# Patient Record
Sex: Female | Born: 1935 | Race: White | Hispanic: No | Smoking: Former smoker
Health system: Southern US, Community
[De-identification: ages and names within clinical notes are randomized; demographics above are authoritative.]

## PROBLEM LIST (undated history)

## (undated) DIAGNOSIS — C50919 Malignant neoplasm of unspecified site of unspecified female breast: Secondary | ICD-10-CM

## (undated) DIAGNOSIS — I10 Essential (primary) hypertension: Secondary | ICD-10-CM

## (undated) DIAGNOSIS — R011 Cardiac murmur, unspecified: Secondary | ICD-10-CM

## (undated) DIAGNOSIS — K5730 Diverticulosis of large intestine without perforation or abscess without bleeding: Secondary | ICD-10-CM

## (undated) HISTORY — PX: BREAST SURGERY: SHX581

## (undated) HISTORY — DX: Malignant neoplasm of unspecified site of unspecified female breast: C50.919

## (undated) HISTORY — DX: Cardiac murmur, unspecified: R01.1

## (undated) HISTORY — PX: CATARACT EXTRACTION, BILATERAL: SHX1313

## (undated) HISTORY — DX: Diverticulosis of large intestine without perforation or abscess without bleeding: K57.30

## (undated) HISTORY — DX: Essential (primary) hypertension: I10

---

## 1988-04-18 DIAGNOSIS — C50919 Malignant neoplasm of unspecified site of unspecified female breast: Secondary | ICD-10-CM

## 1988-04-18 HISTORY — DX: Malignant neoplasm of unspecified site of unspecified female breast: C50.919

## 1994-04-18 HISTORY — PX: CHOLECYSTECTOMY: SHX55

## 1996-04-18 HISTORY — PX: TONSILLECTOMY: SUR1361

## 2003-07-02 DIAGNOSIS — R011 Cardiac murmur, unspecified: Secondary | ICD-10-CM

## 2003-07-02 HISTORY — DX: Cardiac murmur, unspecified: R01.1

## 2004-02-02 ENCOUNTER — Ambulatory Visit: Payer: Self-pay | Admitting: Oncology

## 2004-06-21 ENCOUNTER — Ambulatory Visit: Payer: Self-pay | Admitting: General Surgery

## 2004-08-14 LAB — HM PAP SMEAR: HM Pap smear: NORMAL

## 2005-01-31 ENCOUNTER — Ambulatory Visit: Payer: Self-pay | Admitting: Oncology

## 2005-02-16 ENCOUNTER — Ambulatory Visit: Payer: Self-pay | Admitting: Oncology

## 2005-06-27 ENCOUNTER — Ambulatory Visit: Payer: Self-pay | Admitting: General Surgery

## 2005-08-14 LAB — HM COLONOSCOPY: HM Colonoscopy: NORMAL

## 2006-06-26 ENCOUNTER — Ambulatory Visit: Payer: Self-pay | Admitting: General Surgery

## 2007-01-17 ENCOUNTER — Ambulatory Visit: Payer: Self-pay | Admitting: Oncology

## 2007-01-31 ENCOUNTER — Ambulatory Visit: Payer: Self-pay | Admitting: Oncology

## 2007-02-17 ENCOUNTER — Ambulatory Visit: Payer: Self-pay | Admitting: Oncology

## 2007-04-19 HISTORY — PX: COLONOSCOPY: SHX174

## 2007-06-25 ENCOUNTER — Ambulatory Visit: Payer: Self-pay | Admitting: General Surgery

## 2007-12-21 ENCOUNTER — Ambulatory Visit: Payer: Self-pay | Admitting: General Surgery

## 2008-02-17 ENCOUNTER — Ambulatory Visit: Payer: Self-pay | Admitting: Oncology

## 2008-02-27 ENCOUNTER — Ambulatory Visit: Payer: Self-pay | Admitting: Oncology

## 2008-03-18 ENCOUNTER — Ambulatory Visit: Payer: Self-pay | Admitting: Oncology

## 2008-06-23 ENCOUNTER — Ambulatory Visit: Payer: Self-pay | Admitting: General Surgery

## 2009-02-16 ENCOUNTER — Ambulatory Visit: Payer: Self-pay | Admitting: Oncology

## 2009-02-23 ENCOUNTER — Ambulatory Visit: Payer: Self-pay | Admitting: Oncology

## 2009-03-18 ENCOUNTER — Ambulatory Visit: Payer: Self-pay | Admitting: Oncology

## 2009-07-08 ENCOUNTER — Ambulatory Visit: Payer: Self-pay | Admitting: General Surgery

## 2010-07-12 ENCOUNTER — Ambulatory Visit: Payer: Self-pay | Admitting: General Surgery

## 2011-08-01 ENCOUNTER — Ambulatory Visit: Payer: Self-pay | Admitting: General Surgery

## 2011-08-08 LAB — HM MAMMOGRAPHY: HM Mammogram: NORMAL

## 2011-08-15 ENCOUNTER — Encounter: Payer: Self-pay | Admitting: Internal Medicine

## 2011-08-15 ENCOUNTER — Ambulatory Visit (INDEPENDENT_AMBULATORY_CARE_PROVIDER_SITE_OTHER): Payer: Medicare Other | Admitting: Internal Medicine

## 2011-08-15 VITALS — BP 146/62 | HR 85 | Temp 98.5°F | Resp 16 | Ht 62.0 in | Wt 124.8 lb

## 2011-08-15 DIAGNOSIS — I10 Essential (primary) hypertension: Secondary | ICD-10-CM

## 2011-08-15 DIAGNOSIS — R5381 Other malaise: Secondary | ICD-10-CM

## 2011-08-15 DIAGNOSIS — I6529 Occlusion and stenosis of unspecified carotid artery: Secondary | ICD-10-CM | POA: Insufficient documentation

## 2011-08-15 DIAGNOSIS — Z0000 Encounter for general adult medical examination without abnormal findings: Secondary | ICD-10-CM

## 2011-08-15 DIAGNOSIS — R5383 Other fatigue: Secondary | ICD-10-CM

## 2011-08-15 DIAGNOSIS — R0989 Other specified symptoms and signs involving the circulatory and respiratory systems: Secondary | ICD-10-CM

## 2011-08-15 DIAGNOSIS — K5730 Diverticulosis of large intestine without perforation or abscess without bleeding: Secondary | ICD-10-CM | POA: Insufficient documentation

## 2011-08-15 MED ORDER — AMLODIPINE BESYLATE 5 MG PO TABS: 5.0000 mg | ORAL_TABLET | Freq: Every day | ORAL | Status: DC

## 2011-08-15 MED ORDER — LISINOPRIL 40 MG PO TABS: 40.0000 mg | ORAL_TABLET | Freq: Every day | ORAL | Status: DC

## 2011-08-15 NOTE — Assessment & Plan Note (Signed)
Well controlled on current medications.  No changes today.  Renal function recenlty done by Francia Greaves, records requested.

## 2011-08-15 NOTE — Assessment & Plan Note (Addendum)
Referral to [facility] for eval underway.  Continue asa, check fasting lipids,  Goal LDL 70

## 2011-08-15 NOTE — Patient Instructions (Signed)
We are referring you to Dr Wyn Quaker for evaluation of right carotid artery bruit  Return for fasting lipids.

## 2011-08-15 NOTE — Progress Notes (Signed)
Patient ID: Miranda Watkins, female   DOB: 1928-02-08, 78 y.o.   MRN: [id number] .   The patient is here for annual Medicare wellness examination and management of other chronic and acute problems. She is a healthy 78 yr old with a history of BRCA in situ 1992 and 2000 and hypertension who is transferring form [place].  Breast surveillance is managed by Dr. Evette Cristal with annual breast exam and mammograms, s/p history of  Lumpectomy and XRT .  She had her exam last week with Dr. Evette Cristal, mammogram done. History of cholectystectomy by Evette Cristal at age 78. She leads an active lifestyle, plays gold three times a week and bridge once a week.  Independent of all ADLs.      The risk factors are reflected in the social history.  The roster of all physicians providing medical care to patient - is listed in the Snapshot section of the chart.  Activities of daily living:  The patient is 100% independent in all ADLs: dressing, toileting, feeding as well as independent mobility  Home safety : The patient has smoke detectors in the home. They wear seatbelts.  There are no firearms at home. There is no violence in the home.   There is no risks for hepatitis, STDs or HIV. There is no   history of blood transfusion. They have no travel history to infectious disease endemic areas of the world.  The patient has seen their dentist in the last six month. They have seen their eye doctor in the last year. They admit to slight hearing difficulty with regard to whispered voices and some television programs.  They have deferred audiologic testing in the last year.  They do not  have excessive sun exposure. Discussed the need for sun protection: hats, long sleeves and use of sunscreen if there is significant sun exposure.   Diet: the importance of a healthy diet is discussed. They do have a healthy diet.  The benefits of regular aerobic exercise were discussed. She walks 4 times per week ,  20 minutes.   Depression screen: there  are no signs or vegative symptoms of depression- irritability, change in appetite, anhedonia, sadness/tearfullness.  Cognitive assessment: the patient manages all their financial and personal affairs and is actively engaged. They could relate day,date,year and events; recalled 2/3 objects at 3 minutes; performed clock-face test normally.  The following portions of the patient's history were reviewed and updated as appropriate: allergies, current medications, past family history, past medical history,  past surgical history, past social history  and problem list.  Visual acuity was not assessed per patient preference since she has regular follow up with her ophthalmologist. Hearing and body mass index were assessed and reviewed.   During the course of the visit the patient was educated and counseled about appropriate screening and preventive services including : fall prevention , diabetes screening, nutrition counseling, colorectal cancer screening, and recommended immunizations.   BP 146/62  Pulse 85  Temp(Src) 98.5 F (36.9 C) (Oral)  Resp 16  Ht 5\' 2"  (1.575 m)  Wt 124 lb 12 oz (56.586 kg)  BMI 22.82 kg/m2  SpO2 94%  General appearance: alert, cooperative and appears stated age Ears: normal TM's and external ear canals both ears Throat: lips, mucosa, and tongue normal; teeth and gums normal Neck: no adenopathy,right carotid bruit, supple, symmetrical, trachea midline and thyroid not enlarged, symmetric, no tenderness/mass/nodules Back: symmetric, no curvature. ROM normal. No CVA tenderness. Lungs: clear to auscultation bilaterally Heart: regular rate and  rhythm, S1, S2 normal, no murmur, click, rub or gallop Abdomen: soft, non-tender; bowel sounds normal; no masses,  no organomegaly Pulses: 2+ and symmetric Skin: Skin color, texture, turgor normal. No rashes or lesions Lymph nodes: Cervical, supraclavicular, and axillary nodes normal.  Assessment and Plan  Right carotid  bruit Referral to [facility] for eval underway.  Continue asa, check fasting lipids,  Goal LDL 70  Hypertension Well controlled on current medications for her age .  No changes today.  Renal function recenlty done by Francia Greaves, records requested.     Updated Medication List Outpatient Encounter Prescriptions as of 08/15/2011  Medication Sig Dispense Refill  . amLODipine (NORVASC) 5 MG tablet Take 1 tablet (5 mg total) by mouth daily.  30 tablet  11  . aspirin 81 MG tablet Take 81 mg by mouth daily.      . Calcium Carbonate-Vitamin D (CALCIUM + D) 600-200 MG-UNIT TABS Take by mouth.      . Cranberry 1000 MG CAPS Take by mouth.      Marland Kitchen lisinopril (PRINIVIL,ZESTRIL) 40 MG tablet Take 1 tablet (40 mg total) by mouth daily.  90 tablet  3  . Psyllium (METAMUCIL PO) Take by mouth.      . DISCONTD: amLODipine (NORVASC) 5 MG tablet Take 5 mg by mouth daily.      Marland Kitchen DISCONTD: lisinopril (PRINIVIL,ZESTRIL) 40 MG tablet Take 40 mg by mouth daily.

## 2011-08-16 ENCOUNTER — Telehealth: Payer: Self-pay | Admitting: Internal Medicine

## 2011-08-16 NOTE — Telephone Encounter (Signed)
Please have Dr Darrick Huntsman sign an order for the carotid ultrasound and fax it to [facility] [phone]

## 2011-08-17 ENCOUNTER — Other Ambulatory Visit (INDEPENDENT_AMBULATORY_CARE_PROVIDER_SITE_OTHER): Payer: Medicare Other | Admitting: *Deleted

## 2011-08-17 DIAGNOSIS — R5381 Other malaise: Secondary | ICD-10-CM

## 2011-08-17 DIAGNOSIS — R5383 Other fatigue: Secondary | ICD-10-CM

## 2011-08-17 DIAGNOSIS — I10 Essential (primary) hypertension: Secondary | ICD-10-CM

## 2011-08-17 LAB — CBC WITH DIFFERENTIAL/PLATELET
Eosinophils Relative: 1.7 % (ref 0.0–5.0)
HCT: 46 % (ref 36.0–46.0)
Hemoglobin: 15.2 g/dL — ABNORMAL HIGH (ref 12.0–15.0)
Lymphs Abs: 2.7 10*3/uL (ref 0.7–4.0)
MCV: 89.5 fl (ref 78.0–100.0)
Monocytes Absolute: 0.9 10*3/uL (ref 0.1–1.0)
Neutro Abs: 6.4 10*3/uL (ref 1.4–7.7)
Platelets: 242 10*3/uL (ref 150.0–400.0)
WBC: 10.2 10*3/uL (ref 4.5–10.5)

## 2011-08-17 LAB — LIPID PANEL
Cholesterol: 167 mg/dL (ref 0–200)
HDL: 59.3 mg/dL (ref >39.00)
VLDL: 27 mg/dL (ref 0.0–40.0)

## 2011-08-18 LAB — COMPLETE METABOLIC PANEL WITH GFR
Albumin: 4.6 g/dL (ref 3.5–5.2)
Alkaline Phosphatase: 83 U/L (ref 39–117)
BUN: 16 mg/dL (ref 6–23)
GFR, Est Non African American: 82 mL/min
Glucose, Bld: 114 mg/dL — ABNORMAL HIGH (ref 70–99)
Total Bilirubin: 0.6 mg/dL (ref 0.3–1.2)

## 2011-08-22 NOTE — Telephone Encounter (Signed)
I have filled out my part of the referral form for [facility] waiting for signature from Dr. Darrick Huntsman.

## 2011-09-06 NOTE — Telephone Encounter (Signed)
I have faxed over order after Dr. Darrick Huntsman signed.

## 2011-09-09 ENCOUNTER — Telehealth: Payer: Self-pay | Admitting: Internal Medicine

## 2011-09-09 DIAGNOSIS — R0989 Other specified symptoms and signs involving the circulatory and respiratory systems: Secondary | ICD-10-CM

## 2011-09-09 NOTE — Telephone Encounter (Signed)
Her recent carotid artery study showed that the sound I was hearing was due to external Carotid artery stenosis,  The internal CA was notable for mild stenosis,  And is not a t surgical candidate.  bc the risks of surgery outweighte benefits until the artery is > 80% stenosed.

## 2011-09-09 NOTE — Telephone Encounter (Signed)
Patient informed/SLS  

## 2011-09-09 NOTE — Assessment & Plan Note (Signed)
Secondary to external CA stenosis,  Right ICA was 1-49% stenosis by recent carotid duplex exam by Festus Barren

## 2011-09-15 ENCOUNTER — Encounter: Payer: Self-pay | Admitting: Internal Medicine

## 2012-04-18 HISTORY — PX: BASAL CELL CARCINOMA EXCISION: SHX1214

## 2012-06-09 ENCOUNTER — Encounter: Payer: Self-pay | Admitting: *Deleted

## 2012-08-06 ENCOUNTER — Ambulatory Visit: Payer: Self-pay | Admitting: General Surgery

## 2012-08-07 ENCOUNTER — Encounter: Payer: Self-pay | Admitting: General Surgery

## 2012-08-15 ENCOUNTER — Encounter: Payer: Medicare Other | Admitting: Internal Medicine

## 2012-08-21 ENCOUNTER — Telehealth: Payer: Self-pay

## 2012-08-21 NOTE — Telephone Encounter (Signed)
Patient called and stated she has had problems with her gout flaring up she is wondering if the lisinopril and amlodipine causing her gout to flare up. And she wants to know is there a diuretic medication that you can prescribe for her to help with her gout.

## 2012-08-21 NOTE — Telephone Encounter (Signed)
No, the meds did not cause gout to flare up,  bvut diuretics will aggravate gout.  She will have to be seen,  Before I prescribe anything bc  It has been over one year and she is overdue for labs and follow up on hypertension

## 2012-08-22 NOTE — Telephone Encounter (Signed)
Patient notified and has appointment for 09/14/12

## 2012-08-27 ENCOUNTER — Ambulatory Visit: Payer: Self-pay | Admitting: General Surgery

## 2012-08-29 ENCOUNTER — Encounter: Payer: Self-pay | Admitting: General Surgery

## 2012-08-29 ENCOUNTER — Other Ambulatory Visit: Payer: Self-pay | Admitting: *Deleted

## 2012-08-29 ENCOUNTER — Ambulatory Visit (INDEPENDENT_AMBULATORY_CARE_PROVIDER_SITE_OTHER): Payer: Medicare Other | Admitting: General Surgery

## 2012-08-29 VITALS — BP 124/52 | HR 80 | Resp 12 | Ht 61.0 in | Wt 126.0 lb

## 2012-08-29 DIAGNOSIS — Z853 Personal history of malignant neoplasm of breast: Secondary | ICD-10-CM

## 2012-08-29 MED ORDER — AMLODIPINE BESYLATE 5 MG PO TABS: 5.0000 mg | ORAL_TABLET | Freq: Every day | ORAL | Status: DC

## 2012-08-29 MED ORDER — LISINOPRIL 40 MG PO TABS: 40.0000 mg | ORAL_TABLET | Freq: Every day | ORAL | Status: DC

## 2012-08-29 NOTE — Progress Notes (Signed)
Patient ID: Miranda Watkins, female   DOB: 05-Jul-1927, 79 y.o.   MRN: [id number]  Chief Complaint  Patient presents with  . Follow-up    mammogram    HPI Miranda Watkins is a 79 y.o. female here today for her following up mammogram done on 08/07/12 cat 1. Patient has a history of bilateral breast cancer s/p lumpectomy on both sides followed by radiation. Patient reports regular self breast checks as well as mammograms. No known family history of breast cancer.     HPI  Past Medical History  Diagnosis Date  . breast cancer in situ   . Hypertension   . Diverticulosis of colon   . Heart murmur 07/02/2003    Past Surgical History  Procedure Laterality Date  . Cataract extraction, bilateral      Miranda Watkins,  remote,   . Breast surgery      bilalteral 1992, and 2000  . Tonsillectomy  1998  . Cholecystectomy  1996  . Colonoscopy  2009    Dr. Mechele Collin  . Basal cell carcinoma excision  2014    face    Family History  Problem Relation Age of Onset  . Hypertension Mother   . Cancer Father     lung cancer, smoker  . Hypertension Sister     Social History History  Substance Use Topics  . Smoking status: Former Smoker -- 40 years    Types: Cigarettes    Quit date: 08/15/1983  . Smokeless tobacco: Never Used  . Alcohol Use: 4.2 oz/week    7 Glasses of wine per week    No Known Allergies  Current Outpatient Prescriptions  Medication Sig Dispense Refill  . amLODipine (NORVASC) 5 MG tablet Take 1 tablet (5 mg total) by mouth daily.  30 tablet  5  . Calcium Carbonate-Vitamin D (CALCIUM + D) 600-200 MG-UNIT TABS Take by mouth.      Marland Kitchen lisinopril (PRINIVIL,ZESTRIL) 40 MG tablet Take 1 tablet (40 mg total) by mouth daily.  90 tablet  3   No current facility-administered medications for this visit.    Review of Systems Review of Systems  Constitutional: Negative.   Respiratory: Negative.   Cardiovascular: Negative.     Blood pressure 124/52, pulse 80, resp. rate 12, height 5\' 1"   (1.549 m), weight 126 lb (57.153 kg).  Physical Exam Physical Exam  Constitutional: She is oriented to person, place, and time. She appears well-developed and well-nourished.  Eyes: Conjunctivae are normal. No scleral icterus.  Neck: Trachea normal. No mass and no thyromegaly present.  Cardiovascular: Normal rate, regular rhythm, normal heart sounds and normal pulses.   No murmur heard. Pulmonary/Chest: Effort normal and breath sounds normal. Right breast exhibits no inverted nipple, no mass, no nipple discharge, no skin change and no tenderness. Left breast exhibits no inverted nipple, no mass, no nipple discharge, no skin change and no tenderness.  Abdominal: Soft. Bowel sounds are normal. There is no hepatosplenomegaly. There is no tenderness. No hernia.  Lymphadenopathy:    She has no cervical adenopathy.    She has no axillary adenopathy.  Neurological: She is alert and oriented to person, place, and time.  Skin: Skin is warm and dry.    Data Reviewed Mammogram reviewed category 1.  Assessment    Stable exam. History of bilateral breast cancer    Plan    1 year bilateral diagnostic mammogram.        Melat Wrisley G 08/30/2012, 8:04 PM

## 2012-08-29 NOTE — Progress Notes (Signed)
The patient has been asked to return to the office in one year for a bilateral diagnostic mammogram. 

## 2012-08-29 NOTE — Patient Instructions (Addendum)
Patient to return in 1 year with bilateral diagnostic mammogram.  

## 2012-08-30 ENCOUNTER — Encounter: Payer: Self-pay | Admitting: General Surgery

## 2012-09-14 ENCOUNTER — Ambulatory Visit (INDEPENDENT_AMBULATORY_CARE_PROVIDER_SITE_OTHER): Payer: Medicare Other | Admitting: Internal Medicine

## 2012-09-14 ENCOUNTER — Encounter: Payer: Self-pay | Admitting: Internal Medicine

## 2012-09-14 VITALS — BP 142/68 | HR 74 | Temp 98.1°F | Resp 14 | Ht 62.0 in | Wt 126.0 lb

## 2012-09-14 DIAGNOSIS — Z23 Encounter for immunization: Secondary | ICD-10-CM

## 2012-09-14 DIAGNOSIS — R5383 Other fatigue: Secondary | ICD-10-CM

## 2012-09-14 DIAGNOSIS — M109 Gout, unspecified: Secondary | ICD-10-CM

## 2012-09-14 DIAGNOSIS — Z79899 Other long term (current) drug therapy: Secondary | ICD-10-CM

## 2012-09-14 DIAGNOSIS — E559 Vitamin D deficiency, unspecified: Secondary | ICD-10-CM

## 2012-09-14 DIAGNOSIS — R0989 Other specified symptoms and signs involving the circulatory and respiratory systems: Secondary | ICD-10-CM

## 2012-09-14 DIAGNOSIS — Z0000 Encounter for general adult medical examination without abnormal findings: Secondary | ICD-10-CM

## 2012-09-14 DIAGNOSIS — I10 Essential (primary) hypertension: Secondary | ICD-10-CM

## 2012-09-14 DIAGNOSIS — Z1211 Encounter for screening for malignant neoplasm of colon: Secondary | ICD-10-CM

## 2012-09-14 DIAGNOSIS — I739 Peripheral vascular disease, unspecified: Secondary | ICD-10-CM

## 2012-09-14 DIAGNOSIS — R5381 Other malaise: Secondary | ICD-10-CM

## 2012-09-14 MED ORDER — TETANUS-DIPHTH-ACELL PERTUSSIS 5-2.5-18.5 LF-MCG/0.5 IM SUSP: 0.5000 mL | Freq: Once | INTRAMUSCULAR | Status: DC

## 2012-09-14 MED ORDER — TRIAMCINOLONE ACETONIDE 0.1 % EX CREA: TOPICAL_CREAM | Freq: Two times a day (BID) | CUTANEOUS | Status: DC

## 2012-09-14 NOTE — Progress Notes (Signed)
Patient ID: Miranda Watkins, female   DOB: 1927-08-13, 79 y.o.   MRN: [id number]  The patient is here for annual Medicare wellness examination and management of other chronic and acute problems.  Had  Gout in feet ,  April.  Big toe.    Mammogram done in April by Evette Cristal along with breast exam.    The risk factors are reflected in the social history.  The roster of all physicians providing medical care to patient - is listed in the Snapshot section of the chart.  Activities of daily living:  The patient is 100% independent in all ADLs: dressing, toileting, feeding as well as independent mobility  Home safety : The patient has smoke detectors in the home. They wear seatbelts.  There are no firearms at home. There is no violence in the home.   There is no risks for hepatitis, STDs or HIV. There is no   history of blood transfusion. They have no travel history to infectious disease endemic areas of the world.  The patient has seen their dentist in the last six month. They have seen their eye doctor in the last year. They admit to slight hearing difficulty with regard to whispered voices and some television programs.  They have deferred audiologic testing in the last year.  They do not  have excessive sun exposure. Discussed the need for sun protection: hats, long sleeves and use of sunscreen if there is significant sun exposure.   Diet: the importance of a healthy diet is discussed. They do have a healthy diet.  The benefits of regular aerobic exercise were discussed. She walks 4 times per week ,  20 minutes.   Depression screen: there are no signs or vegative symptoms of depression- irritability, change in appetite, anhedonia, sadness/tearfullness.  Cognitive assessment: the patient manages all their financial and personal affairs and is actively engaged. They could relate day,date,year and events; recalled 2/3 objects at 3 minutes; performed clock-face test normally.  The following portions of the  patient's history were reviewed and updated as appropriate: allergies, current medications, past family history, past medical history,  past surgical history, past social history  and problem list.  Visual acuity was not assessed per patient preference since she has regular follow up with her ophthalmologist. Hearing and body mass index were assessed and reviewed.   During the course of the visit the patient was educated and counseled about appropriate screening and preventive services including : fall prevention , diabetes screening, nutrition counseling, colorectal cancer screening, and recommended immunizations.    Objective:  BP 142/68  Pulse 74  Temp(Src) 98.1 F (36.7 C) (Oral)  Resp 14  Ht 5\' 2"  (1.575 m)  Wt 126 lb (57.153 kg)  BMI 23.04 kg/m2  SpO2 98%  General Appearance:    Alert, cooperative, no distress, appears stated age  Head:    Normocephalic, without obvious abnormality, atraumatic  Eyes:    PERRL, conjunctiva/corneas clear, EOM's intact, fundi    benign, both eyes  Ears:    Normal TM's and external ear canals, both ears  Nose:   Nares normal, septum midline, mucosa normal, no drainage    or sinus tenderness  Throat:   Lips, mucosa, and tongue normal; teeth and gums normal  Neck:   Supple, symmetrical, trachea midline, no adenopathy;    thyroid:  no enlargement/tenderness/nodules; no carotid   bruit or JVD  Back:     Symmetric, no curvature, ROM normal, no CVA tenderness  Lungs:  Clear to auscultation bilaterally, respirations unlabored  Chest Wall:    No tenderness or deformity   Heart:    Regular rate and rhythm, S1 and S2 normal, no murmur, rub   or gallop  Breast Exam:    No tenderness, masses, or nipple abnormality  Abdomen:     Soft, non-tender, bowel sounds active all four quadrants,    no masses, no organomegaly     Extremities:   Extremities normal, atraumatic, no cyanosis or edema  Pulses:   2+ and symmetric all extremities  Skin:   Skin color,  texture, turgor normal, no rashes or lesions  Lymph nodes:   Cervical, supraclavicular, and axillary nodes normal  Neurologic:   CNII-XII intact, normal strength, sensation and reflexes    throughout   Assessment and Plan:  Carotid stenosis She is due repeat carotid doppler. continue asa.   Encounter for Medicare annual wellness exam Annual comprehensive exam was done including breast exam was done. Patient deferred  pelvic exam. All  screenings have been addressed .   Hypertension Well controlled on current regimen. Renal function is overdue, no changes today.   Updated Medication List Outpatient Encounter Prescriptions as of 09/14/2012  Medication Sig Dispense Refill  . amLODipine (NORVASC) 5 MG tablet Take 1 tablet (5 mg total) by mouth daily.  30 tablet  5  . Calcium Carbonate-Vitamin D (CALCIUM + D) 600-200 MG-UNIT TABS Take by mouth.      Marland Kitchen lisinopril (PRINIVIL,ZESTRIL) 40 MG tablet Take 1 tablet (40 mg total) by mouth daily.  90 tablet  3  . TDaP (BOOSTRIX) 5-2.5-18.5 LF-MCG/0.5 injection Inject 0.5 mLs into the muscle once.  0.5 mL  0  . triamcinolone cream (KENALOG) 0.1 % Apply topically 2 (two) times daily.  30 g  3   No facility-administered encounter medications on file as of 09/14/2012.

## 2012-09-14 NOTE — Assessment & Plan Note (Addendum)
She is due repeat carotid doppler. continue asa.

## 2012-09-14 NOTE — Patient Instructions (Addendum)
For your next gout flare you can take 400 to 600 mg ibuprofren every 8 hours along with 500 mg tylenol every 8 hours ,  Continue your cherry  Juice .     We will schedule your follow up carotid ultrasound  Return here for fasting labs (black coffee ,  or water ok but nothing else )

## 2012-09-16 NOTE — Assessment & Plan Note (Addendum)
Well controlled on current regimen. Renal function is overdue , no changes today. 

## 2012-09-16 NOTE — Assessment & Plan Note (Signed)
Annual comprehensive exam was done including breast exam was done. Patient deferred  pelvic exam. All  screenings have been addressed .

## 2012-10-03 ENCOUNTER — Telehealth: Payer: Self-pay | Admitting: *Deleted

## 2012-10-03 ENCOUNTER — Other Ambulatory Visit (INDEPENDENT_AMBULATORY_CARE_PROVIDER_SITE_OTHER): Payer: Medicare Other

## 2012-10-03 DIAGNOSIS — R197 Diarrhea, unspecified: Secondary | ICD-10-CM

## 2012-10-03 DIAGNOSIS — Z1211 Encounter for screening for malignant neoplasm of colon: Secondary | ICD-10-CM

## 2012-10-03 DIAGNOSIS — R5381 Other malaise: Secondary | ICD-10-CM

## 2012-10-03 DIAGNOSIS — E559 Vitamin D deficiency, unspecified: Secondary | ICD-10-CM

## 2012-10-03 DIAGNOSIS — Z79899 Other long term (current) drug therapy: Secondary | ICD-10-CM

## 2012-10-03 DIAGNOSIS — M109 Gout, unspecified: Secondary | ICD-10-CM

## 2012-10-03 DIAGNOSIS — R5383 Other fatigue: Secondary | ICD-10-CM

## 2012-10-03 DIAGNOSIS — I739 Peripheral vascular disease, unspecified: Secondary | ICD-10-CM

## 2012-10-03 DIAGNOSIS — I10 Essential (primary) hypertension: Secondary | ICD-10-CM

## 2012-10-03 LAB — COMPREHENSIVE METABOLIC PANEL
ALT: 20 U/L (ref 0–35)
AST: 24 U/L (ref 0–37)
CO2: 27 mEq/L (ref 19–32)
Chloride: 96 mEq/L (ref 96–112)
Creatinine, Ser: 0.8 mg/dL (ref 0.4–1.2)
GFR: 72.75 mL/min (ref >60.00)
Sodium: 136 mEq/L (ref 135–145)
Total Bilirubin: 0.9 mg/dL (ref 0.3–1.2)
Total Protein: 7.2 g/dL (ref 6.0–8.3)

## 2012-10-03 LAB — CBC WITH DIFFERENTIAL/PLATELET
Basophils Absolute: 0 10*3/uL (ref 0.0–0.1)
Eosinophils Absolute: 0.2 10*3/uL (ref 0.0–0.7)
Hemoglobin: 15.2 g/dL — ABNORMAL HIGH (ref 12.0–15.0)
Lymphocytes Relative: 22 % (ref 12.0–46.0)
Lymphs Abs: 2.4 10*3/uL (ref 0.7–4.0)
MCHC: 33.6 g/dL (ref 30.0–36.0)
Monocytes Relative: 8.3 % (ref 3.0–12.0)
Neutro Abs: 7.4 10*3/uL (ref 1.4–7.7)
Platelets: 258 10*3/uL (ref 150.0–400.0)
RDW: 13.6 % (ref 11.5–14.6)

## 2012-10-03 LAB — LIPID PANEL
Cholesterol: 163 mg/dL (ref 0–200)
HDL: 58 mg/dL (ref >39.00)
LDL Cholesterol: 90 mg/dL (ref 0–99)
Total CHOL/HDL Ratio: 3
Triglycerides: 77 mg/dL (ref 0.0–149.0)

## 2012-10-03 NOTE — Telephone Encounter (Signed)
Pt didn't bring the stool home kit, she brought back the Para-pak  (the 3 bottles)

## 2012-10-03 NOTE — Telephone Encounter (Signed)
It was in there as an open order placed May 30. i have repeated order.

## 2012-10-03 NOTE — Telephone Encounter (Signed)
Pt brought in stool samples but I didn't see an order ?

## 2012-10-03 NOTE — Telephone Encounter (Signed)
ordered

## 2012-10-04 NOTE — Addendum Note (Signed)
Addended by: Montine Circle D on: 10/04/2012 10:29 AM   Modules accepted: Orders

## 2012-10-06 LAB — CLOSTRIDIUM DIFFICILE EIA: CDIFTX: NEGATIVE

## 2012-10-08 LAB — STOOL CULTURE

## 2012-10-30 ENCOUNTER — Telehealth: Payer: Self-pay | Admitting: Internal Medicine

## 2012-10-30 DIAGNOSIS — I6523 Occlusion and stenosis of bilateral carotid arteries: Secondary | ICD-10-CM

## 2012-10-30 NOTE — Telephone Encounter (Signed)
The carotid ultrasound she had done in June showed bilateral disease but not significant to warrant surgery .  I am recommending that she start taking a baby aspirin daily and we will discuss other ways to lower her risk for stroke  at next visit.  Please schedule a visit for September if not already

## 2012-10-30 NOTE — Telephone Encounter (Signed)
Left message to call back  

## 2012-10-30 NOTE — Telephone Encounter (Signed)
Patient returned call and was notified of results and will keep appointment scheduled in Aug.

## 2012-10-30 NOTE — Assessment & Plan Note (Signed)
The carotid ultrasound she had done in June showed bilateral disease but not significant to warrant surgery .  I am recommending that she start taking a baby aspirin daily and we will discuss other ways to lower her risk for stroke  at next visit.  Please schedule a visit for September if not already  

## 2012-11-08 ENCOUNTER — Encounter: Payer: Self-pay | Admitting: Internal Medicine

## 2012-11-19 ENCOUNTER — Ambulatory Visit: Payer: Medicare Other | Admitting: Internal Medicine

## 2012-12-11 ENCOUNTER — Ambulatory Visit: Payer: Self-pay | Admitting: Oncology

## 2012-12-17 ENCOUNTER — Ambulatory Visit: Payer: Self-pay | Admitting: Oncology

## 2013-03-02 ENCOUNTER — Other Ambulatory Visit: Payer: Self-pay | Admitting: Internal Medicine

## 2013-04-01 ENCOUNTER — Other Ambulatory Visit: Payer: Self-pay | Admitting: Internal Medicine

## 2013-07-30 ENCOUNTER — Other Ambulatory Visit: Payer: Self-pay | Admitting: Internal Medicine

## 2013-07-31 MED ORDER — LISINOPRIL 40 MG PO TABS: 40.0000 mg | ORAL_TABLET | Freq: Every day | ORAL | Status: DC

## 2013-08-01 ENCOUNTER — Other Ambulatory Visit: Payer: Self-pay | Admitting: Internal Medicine

## 2013-08-01 ENCOUNTER — Other Ambulatory Visit: Payer: Self-pay | Admitting: *Deleted

## 2013-08-01 MED ORDER — AMLODIPINE BESYLATE 5 MG PO TABS: ORAL_TABLET | ORAL | Status: DC

## 2013-08-01 NOTE — Telephone Encounter (Signed)
Electronic Rx request (2nd request) Pt has not been seen in the past 6 months and does not have any future appointments scheduled. Please advise.

## 2013-08-01 NOTE — Telephone Encounter (Signed)
Refill one 30 days only.  Has not been seen in over 6 months so needs office visit prior to any more refills (2nd time )

## 2013-08-07 ENCOUNTER — Ambulatory Visit: Payer: Self-pay | Admitting: General Surgery

## 2013-08-08 ENCOUNTER — Encounter: Payer: Self-pay | Admitting: General Surgery

## 2013-08-12 ENCOUNTER — Ambulatory Visit: Payer: Self-pay | Admitting: General Surgery

## 2013-08-13 ENCOUNTER — Encounter: Payer: Self-pay | Admitting: General Surgery

## 2013-08-16 HISTORY — PX: MASTECTOMY, RADICAL: SHX710

## 2013-08-21 ENCOUNTER — Ambulatory Visit (INDEPENDENT_AMBULATORY_CARE_PROVIDER_SITE_OTHER): Payer: Medicare Other | Admitting: General Surgery

## 2013-08-21 ENCOUNTER — Other Ambulatory Visit: Payer: Self-pay | Admitting: General Surgery

## 2013-08-21 ENCOUNTER — Encounter: Payer: Self-pay | Admitting: General Surgery

## 2013-08-21 ENCOUNTER — Other Ambulatory Visit: Payer: Medicare Other

## 2013-08-21 VITALS — BP 144/62 | HR 86 | Resp 14 | Ht 62.0 in | Wt 127.0 lb

## 2013-08-21 DIAGNOSIS — N63 Unspecified lump in unspecified breast: Secondary | ICD-10-CM

## 2013-08-21 DIAGNOSIS — Z853 Personal history of malignant neoplasm of breast: Secondary | ICD-10-CM

## 2013-08-21 NOTE — Patient Instructions (Addendum)
CARE AFTER BREAST BIOPSY  1. Leave the dressing on that your doctor applied after surgery. It is waterproof. You may bathe, shower and/or swim. The dressing will probably remain intact until your return office visit. If the dressing comes off, you will see small strips of tape against your skin on the incision. Do not remove these strips.  2. You may want to use a gauze,cloth or similar protection in your bra to prevent rubbing against your dressing and incision. This is not necessary, but you may feel more comfortable doing so.  3. It is recommended that you wear a bra day and night to give support to the breast. This will prevent the weight of the breast from pulling on the incision.  4. Your breast will feel hard and lumpy under the incision. Do not be alarmed. This is the underlying stitching of tissue. Softening of this tissue will occur in time.  5. Make sure you call the office and schedule an appointment in one week after your surgery. The office phone number is [phone]. The nurses at [facility] may have already done this for you.  6. You will notice about a week after your office visit that the strips of the tape on your incision will begin to loosen. These may then be removed.  7. Report to your doctor any of the following:  * Severe pain not relieved by your pain medication  *Redness of the incision  * Drainage from the incision  *Fever greater than 101 degrees    We will call you with your biopsy results.

## 2013-08-21 NOTE — Progress Notes (Signed)
Patient ID: Miranda Watkins, female   DOB: 1927-12-17, 80 y.o.   MRN: [id number]  Chief Complaint  Patient presents with  . Follow-up    mammogram    HPI Miranda Watkins is a 80 y.o. female who presents for a breast evaluation. The most recent mammogram was done on 08/07/13. Patient does perform regular self breast checks and gets regular mammograms done.  Pt had right breast invasive CA in 1992, had lumpectomy, AD and radiation. 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   HPI  Past Medical History  Diagnosis Date  . Hypertension   . Diverticulosis of colon   . Heart murmur 07/02/2003  . Breast cancer     Past Surgical History  Procedure Laterality Date  . Cataract extraction, bilateral      Miranda Watkins,  remote,   . Tonsillectomy  1998  . Cholecystectomy  1996  . Colonoscopy  2009    Dr. Vira Agar  . Basal cell carcinoma excision  2014    face  . Breast surgery      bilateral 1991, and 2000    Family History  Problem Relation Age of Onset  . Hypertension Mother   . Cancer Father     lung cancer, smoker  . Hypertension Sister     Social History History  Substance Use Topics  . Smoking status: Former Smoker -- 40 years    Types: Cigarettes    Quit date: 08/15/1983  . Smokeless tobacco: Never Used  . Alcohol Use: 4.2 oz/week    7 Glasses of wine per week    No Known Allergies  Current Outpatient Prescriptions  Medication Sig Dispense Refill  . amLODipine (NORVASC) 5 MG tablet TAKE 1 TABLET EVERY DAY  30 tablet  0  . Calcium Carbonate-Vitamin D (CALCIUM + D) 600-200 MG-UNIT TABS Take by mouth.      Marland Kitchen lisinopril (PRINIVIL,ZESTRIL) 40 MG tablet Take 1 tablet (40 mg total) by mouth daily.  90 tablet  3  . TDaP (BOOSTRIX) 5-2.5-18.5 LF-MCG/0.5 injection Inject 0.5 mLs into the muscle once.  0.5 mL  0   No current facility-administered medications for this visit.    Review of Systems Review of Systems  Constitutional: Negative.   Respiratory:  Negative.   Cardiovascular: Negative.     Blood pressure 144/62, pulse 86, resp. rate 14, height 5\' 2"  (1.575 m), weight 127 lb (57.607 kg).  Physical Exam Physical Exam  Constitutional: She is oriented to person, place, and time. She appears well-developed and well-nourished.  HENT:  Head: Normocephalic.  Eyes: Conjunctivae are normal. No scleral icterus.  Neck: Neck supple. No thyromegaly present.  Cardiovascular: Normal rate, regular rhythm and normal heart sounds.   Pulses:      Dorsalis pedis pulses are 2+ on the right side, and 2+ on the left side.       Posterior tibial pulses are 2+ on the right side, and 2+ on the left side.  No leg edma  Pulmonary/Chest: Effort normal and breath sounds normal. Right breast exhibits no inverted nipple, no mass, no nipple discharge, no skin change and no tenderness. Left breast exhibits mass. Left breast exhibits no inverted nipple, no nipple discharge, no skin change and no tenderness. Breasts are symmetrical.    Lymphadenopathy:    She has no cervical adenopathy.    She has no axillary adenopathy.  Neurological: She is alert and oriented to person, place, and time.  Skin: Skin is warm and dry.  Data Reviewed Last mammogram reviewed. Spiculated mass left breast 2 ocl  Assessment    New suspicious mass left breast.    Plan    Core biopsy discussed and completed today.        Yariana Hoaglund G Sahej Hauswirth 08/21/2013, 10:00 AM

## 2013-08-27 ENCOUNTER — Telehealth: Payer: Self-pay | Admitting: General Surgery

## 2013-08-27 LAB — PATHOLOGY

## 2013-08-27 NOTE — Telephone Encounter (Signed)
Ms Dantes was advised by phone on 08/24/13 of path showing invasive CA in left breast. Ancillary studies were completed and show ER/PR positive and Her 2 negative.  She was advised of all this today.  I have asked her to come here for full discussion-tomorrow 2.30pm.

## 2013-08-28 ENCOUNTER — Encounter: Payer: Self-pay | Admitting: General Surgery

## 2013-08-28 ENCOUNTER — Ambulatory Visit (INDEPENDENT_AMBULATORY_CARE_PROVIDER_SITE_OTHER): Payer: Medicare Other | Admitting: General Surgery

## 2013-08-28 VITALS — BP 152/74 | HR 86 | Resp 14 | Ht 62.0 in | Wt 127.0 lb

## 2013-08-28 DIAGNOSIS — Z1211 Encounter for screening for malignant neoplasm of colon: Secondary | ICD-10-CM

## 2013-08-28 DIAGNOSIS — Z853 Personal history of malignant neoplasm of breast: Secondary | ICD-10-CM

## 2013-08-28 DIAGNOSIS — C50419 Malignant neoplasm of upper-outer quadrant of unspecified female breast: Secondary | ICD-10-CM

## 2013-08-28 NOTE — Progress Notes (Signed)
Here today for discussion of biopsy results. The dressing is off and no signs of infection are noted. No bruising noted.   Discussed treatment for her new left breast CA-invasive mammary CA, ER/PR pos, Her negative. She has had prior radiation to left breast. Best option is total mastectomy with SN biopsy.  This is the third breast CA in this pat and feel bilateral mastectomy is reasonable. Advised on operative procedure, risks/benefits, possible need for axillary dissection. Pt is agreeable. She has not had any labs in 1yr. Requested CBC, Met C, CA 27-29  Patient is scheduled for surgery at [facility] on 09/06/13. She will pre admit at the hospital on 09/02/13 at 12 pm. She will arrive at the Radiology desk on 09/06/13 at 8:30 am. Patient is aware of dates, times, and all instructions.  

## 2013-08-28 NOTE — Patient Instructions (Signed)
Patient is scheduled for surgery at [facility] on 09/06/13. She will pre admit at the hospital on 09/02/13 at 12 pm. She will arrive at the Radiology desk on 09/06/13 at 8:30 am. Patient is aware of dates, times, and all instructions.

## 2013-08-29 ENCOUNTER — Telehealth: Payer: Self-pay | Admitting: *Deleted

## 2013-08-29 ENCOUNTER — Other Ambulatory Visit: Payer: Self-pay | Admitting: General Surgery

## 2013-08-29 DIAGNOSIS — Z853 Personal history of malignant neoplasm of breast: Secondary | ICD-10-CM

## 2013-08-29 DIAGNOSIS — C50419 Malignant neoplasm of upper-outer quadrant of unspecified female breast: Secondary | ICD-10-CM

## 2013-08-29 LAB — CBC WITH DIFFERENTIAL
Basophils Absolute: 0 10*3/uL (ref 0.0–0.2)
Basos: 0 %
EOS: 1 %
Eosinophils Absolute: 0.1 10*3/uL (ref 0.0–0.4)
HCT: 43.7 % (ref 34.0–46.6)
Hemoglobin: 14.8 g/dL (ref 11.1–15.9)
IMMATURE GRANULOCYTES: 0 %
Immature Grans (Abs): 0 10*3/uL (ref 0.0–0.1)
LYMPHS: 21 %
Lymphocytes Absolute: 2.3 10*3/uL (ref 0.7–3.1)
MCH: 29.6 pg (ref 26.6–33.0)
MCHC: 33.9 g/dL (ref 31.5–35.7)
MCV: 87 fL (ref 79–97)
MONOCYTES: 11 %
MONOS ABS: 1.2 10*3/uL — AB (ref 0.1–0.9)
NEUTROS PCT: 67 %
Neutrophils Absolute: 7.3 10*3/uL — ABNORMAL HIGH (ref 1.4–7.0)
PLATELETS: 280 10*3/uL (ref 150–379)
RBC: 5 x10E6/uL (ref 3.77–5.28)
RDW: 13.1 % (ref 12.3–15.4)
WBC: 10.9 10*3/uL — AB (ref 3.4–10.8)

## 2013-08-29 LAB — COMPREHENSIVE METABOLIC PANEL
A/G RATIO: 2.2 (ref 1.1–2.5)
ALBUMIN: 4.8 g/dL — AB (ref 3.5–4.7)
ALT: 18 IU/L (ref 0–32)
AST: 23 IU/L (ref 0–40)
Alkaline Phosphatase: 80 IU/L (ref 39–117)
BILIRUBIN TOTAL: 0.4 mg/dL (ref 0.0–1.2)
BUN / CREAT RATIO: 20 (ref 11–26)
BUN: 18 mg/dL (ref 8–27)
CO2: 26 mmol/L (ref 18–29)
Calcium: 10.9 mg/dL — ABNORMAL HIGH (ref 8.7–10.3)
Chloride: 97 mmol/L (ref 97–108)
Creatinine, Ser: 0.9 mg/dL (ref 0.57–1.00)
GFR calc non Af Amer: 59 mL/min/{1.73_m2} — ABNORMAL LOW (ref >59)
GFR, EST AFRICAN AMERICAN: 68 mL/min/{1.73_m2} (ref >59)
GLOBULIN, TOTAL: 2.2 g/dL (ref 1.5–4.5)
Glucose: 123 mg/dL — ABNORMAL HIGH (ref 65–99)
Potassium: 4.7 mmol/L (ref 3.5–5.2)
Sodium: 140 mmol/L (ref 134–144)
Total Protein: 7 g/dL (ref 6.0–8.5)

## 2013-08-29 LAB — CANCER ANTIGEN 27.29: CA 27.29: 34.2 U/mL (ref 0.0–38.6)

## 2013-08-29 NOTE — Telephone Encounter (Signed)
Pt is having a mastectomy and wants to talk to you about it before surgery.

## 2013-09-02 ENCOUNTER — Ambulatory Visit: Payer: Self-pay | Admitting: General Surgery

## 2013-09-03 ENCOUNTER — Encounter: Payer: Self-pay | Admitting: General Surgery

## 2013-09-06 ENCOUNTER — Encounter: Payer: Self-pay | Admitting: General Surgery

## 2013-09-06 ENCOUNTER — Ambulatory Visit: Payer: Self-pay | Admitting: General Surgery

## 2013-09-06 DIAGNOSIS — C50419 Malignant neoplasm of upper-outer quadrant of unspecified female breast: Secondary | ICD-10-CM | POA: Diagnosis not present

## 2013-09-06 DIAGNOSIS — Z803 Family history of malignant neoplasm of breast: Secondary | ICD-10-CM | POA: Diagnosis not present

## 2013-09-06 HISTORY — PX: BREAST SURGERY: SHX581

## 2013-09-10 ENCOUNTER — Encounter: Payer: Self-pay | Admitting: General Surgery

## 2013-09-10 ENCOUNTER — Ambulatory Visit (INDEPENDENT_AMBULATORY_CARE_PROVIDER_SITE_OTHER): Payer: Medicare Other | Admitting: General Surgery

## 2013-09-10 VITALS — BP 150/74 | HR 76 | Resp 14 | Ht 62.0 in | Wt 124.0 lb

## 2013-09-10 DIAGNOSIS — Z853 Personal history of malignant neoplasm of breast: Secondary | ICD-10-CM

## 2013-09-10 NOTE — Progress Notes (Signed)
This is a 80 year old female here today for her post op bilateral mastectomy done on 09/06/13. Patient states she is doing well.  Both incision looks clean and drainage is still above 30 daily. Serosanguinous in nature. Pathology is pending.  F/U in 1 week. If drainage gets less than 16ml per day for 2 days she is to call the office.

## 2013-09-10 NOTE — Patient Instructions (Signed)
Patient to return in one week. Call if the drainage is less than 30 ml for three days.

## 2013-09-11 ENCOUNTER — Encounter: Payer: Self-pay | Admitting: General Surgery

## 2013-09-11 LAB — PATHOLOGY REPORT

## 2013-09-12 ENCOUNTER — Encounter: Payer: Self-pay | Admitting: General Surgery

## 2013-09-13 ENCOUNTER — Telehealth: Payer: Self-pay | Admitting: *Deleted

## 2013-09-13 NOTE — Telephone Encounter (Signed)
Let pt know to call here Mon am if drainage is less than 30 ml per day.

## 2013-09-13 NOTE — Telephone Encounter (Signed)
Pt had a mastectomy and stated that by Monday her CCs will be below 43ml, so does she need to come in and get her drained removed?

## 2013-09-17 ENCOUNTER — Ambulatory Visit (INDEPENDENT_AMBULATORY_CARE_PROVIDER_SITE_OTHER): Payer: Medicare Other | Admitting: *Deleted

## 2013-09-17 DIAGNOSIS — C50419 Malignant neoplasm of upper-outer quadrant of unspecified female breast: Secondary | ICD-10-CM

## 2013-09-17 NOTE — Patient Instructions (Signed)
Follow up As scheduled 

## 2013-09-17 NOTE — Progress Notes (Signed)
Here today for drain/wound check. Drainage less than 30 ml a day for 3 days. Bilateral drains removed. Neosporin and gauze with Tegaderm applied. Mastectomy sites are clean and no sign of infection noted. Follow up with MD as scheduled.

## 2013-09-18 ENCOUNTER — Ambulatory Visit (INDEPENDENT_AMBULATORY_CARE_PROVIDER_SITE_OTHER): Payer: Medicare Other | Admitting: General Surgery

## 2013-09-18 ENCOUNTER — Encounter: Payer: Self-pay | Admitting: General Surgery

## 2013-09-18 VITALS — BP 118/58 | HR 80 | Resp 12 | Ht 62.0 in | Wt 124.0 lb

## 2013-09-18 DIAGNOSIS — C50419 Malignant neoplasm of upper-outer quadrant of unspecified female breast: Secondary | ICD-10-CM

## 2013-09-18 NOTE — Progress Notes (Signed)
This is a 80 year old female here today for her post op bilateral mastectomy done on 09/06/13. Patient states she is doing well.  Drains were removed yesterday.  Both incisions look clean.  Follow up in 2 weeks. Path report- tumor size is 1.8cm, SN neg. This is T1c,N0, M0. This is the third primary in her breast. Pt is not keen on chemo. She was offered Oncotype DX testing and she is agreeable.   Oncotype ordered.

## 2013-09-18 NOTE — Patient Instructions (Signed)
The patient is aware to call back for any questions or concerns.  

## 2013-09-26 ENCOUNTER — Encounter: Payer: Self-pay | Admitting: General Surgery

## 2013-09-30 ENCOUNTER — Other Ambulatory Visit: Payer: Self-pay | Admitting: Internal Medicine

## 2013-10-02 ENCOUNTER — Ambulatory Visit (INDEPENDENT_AMBULATORY_CARE_PROVIDER_SITE_OTHER): Payer: Medicare Other | Admitting: General Surgery

## 2013-10-02 VITALS — BP 144/68 | HR 68 | Resp 12 | Ht 62.0 in | Wt 126.0 lb

## 2013-10-02 DIAGNOSIS — C50419 Malignant neoplasm of upper-outer quadrant of unspecified female breast: Secondary | ICD-10-CM

## 2013-10-02 NOTE — Progress Notes (Signed)
Patient ID: Miranda Watkins, female   DOB: 04-04-1928, 80 y.o.   MRN: [id number]  The patient presents for a post op bilateral mastectomy visit. The procedure was performed on 09/06/13.   Mastectomy sites are well healing. No signs of infection or reoccurrence.  Seroma present at right mastectomy site. 22 ml drained at today's visit. Oncotype still pending.   Patient to return in 10 days for follow up.

## 2013-10-02 NOTE — Patient Instructions (Signed)
Patient to return in 10 days for follow up. The patient is aware to call back for any questions or concerns.

## 2013-10-03 ENCOUNTER — Encounter: Payer: Self-pay | Admitting: General Surgery

## 2013-10-10 ENCOUNTER — Encounter: Payer: Self-pay | Admitting: General Surgery

## 2013-10-10 ENCOUNTER — Ambulatory Visit (INDEPENDENT_AMBULATORY_CARE_PROVIDER_SITE_OTHER): Payer: Self-pay | Admitting: General Surgery

## 2013-10-10 VITALS — BP 152/74 | HR 84 | Resp 16 | Ht 62.0 in | Wt 121.0 lb

## 2013-10-10 DIAGNOSIS — C50419 Malignant neoplasm of upper-outer quadrant of unspecified female breast: Secondary | ICD-10-CM

## 2013-10-10 NOTE — Progress Notes (Signed)
The patient presents for a post op bilateral mastectomy visit. The procedure was performed on 09/06/13.  Mastectomy sites are well healing. No signs of infection or reoccurrence. No seroma reoccurrences  Oncotype still pending.

## 2013-10-14 ENCOUNTER — Encounter: Payer: Self-pay | Admitting: General Surgery

## 2013-10-14 NOTE — Patient Instructions (Signed)
Will contact pt once oncotype is available.

## 2013-10-16 ENCOUNTER — Telehealth: Payer: Self-pay | Admitting: General Surgery

## 2013-10-16 DIAGNOSIS — C50412 Malignant neoplasm of upper-outer quadrant of left female breast: Secondary | ICD-10-CM

## 2013-10-16 MED ORDER — ANASTROZOLE 1 MG PO TABS: 1.0000 mg | ORAL_TABLET | Freq: Every day | ORAL | Status: DC

## 2013-10-16 NOTE — Telephone Encounter (Signed)
Oncotype score was 17, middle of range. No significant benefit with chemo. Pt advised by phone today.  Will treat with Arimedex for 57yrs. Pt ok with this . She is to f/u with me in 2 months

## 2013-10-16 NOTE — Telephone Encounter (Signed)
Oncotype score was 17, middle of range. No significant benefit with chemo. Pt advised by phone today. Will treat with Arimedex for 55yrs. Pt ok with this . She is to f/u with me in 2 weeeks.

## 2013-10-17 LAB — HER-2 / NEU, FISH
AVG NUM HER-2 SIGNALS/NUCLEUS: 2
Avg Num CEP17 probes/nucleus:: 1.8
HER-2/CEP17 RATIO: 1.1
Number of Observers:: 2
Number of Tumor Cells Counted:: 40

## 2013-10-17 LAB — ER/PR,IMMUNOHISTOCHEM,PARAFFIN: Progesterone Recp IP: 90 %

## 2013-10-30 ENCOUNTER — Encounter: Payer: Self-pay | Admitting: Internal Medicine

## 2013-10-30 ENCOUNTER — Ambulatory Visit (INDEPENDENT_AMBULATORY_CARE_PROVIDER_SITE_OTHER): Payer: Medicare Other | Admitting: Internal Medicine

## 2013-10-30 VITALS — BP 130/60 | HR 85 | Temp 98.0°F | Ht 62.0 in | Wt 125.8 lb

## 2013-10-30 DIAGNOSIS — R5383 Other fatigue: Secondary | ICD-10-CM

## 2013-10-30 DIAGNOSIS — L309 Dermatitis, unspecified: Secondary | ICD-10-CM

## 2013-10-30 DIAGNOSIS — C50419 Malignant neoplasm of upper-outer quadrant of unspecified female breast: Secondary | ICD-10-CM

## 2013-10-30 DIAGNOSIS — R5381 Other malaise: Secondary | ICD-10-CM

## 2013-10-30 DIAGNOSIS — I6529 Occlusion and stenosis of unspecified carotid artery: Secondary | ICD-10-CM

## 2013-10-30 DIAGNOSIS — Z0000 Encounter for general adult medical examination without abnormal findings: Secondary | ICD-10-CM

## 2013-10-30 DIAGNOSIS — F5102 Adjustment insomnia: Secondary | ICD-10-CM

## 2013-10-30 DIAGNOSIS — I10 Essential (primary) hypertension: Secondary | ICD-10-CM

## 2013-10-30 DIAGNOSIS — Z79899 Other long term (current) drug therapy: Secondary | ICD-10-CM

## 2013-10-30 DIAGNOSIS — Z23 Encounter for immunization: Secondary | ICD-10-CM

## 2013-10-30 DIAGNOSIS — L259 Unspecified contact dermatitis, unspecified cause: Secondary | ICD-10-CM

## 2013-10-30 DIAGNOSIS — E559 Vitamin D deficiency, unspecified: Secondary | ICD-10-CM

## 2013-10-30 DIAGNOSIS — I6523 Occlusion and stenosis of bilateral carotid arteries: Secondary | ICD-10-CM

## 2013-10-30 DIAGNOSIS — C50412 Malignant neoplasm of upper-outer quadrant of left female breast: Secondary | ICD-10-CM

## 2013-10-30 LAB — COMPREHENSIVE METABOLIC PANEL
ALT: 19 U/L (ref 0–35)
AST: 25 U/L (ref 0–37)
Albumin: 4.4 g/dL (ref 3.5–5.2)
Alkaline Phosphatase: 74 U/L (ref 39–117)
BUN: 15 mg/dL (ref 6–23)
CALCIUM: 10.3 mg/dL (ref 8.4–10.5)
CHLORIDE: 101 meq/L (ref 96–112)
CO2: 29 mEq/L (ref 19–32)
Creatinine, Ser: 0.7 mg/dL (ref 0.4–1.2)
GFR: 87.53 mL/min (ref >60.00)
GLUCOSE: 120 mg/dL — AB (ref 70–99)
Potassium: 3.9 mEq/L (ref 3.5–5.1)
Sodium: 140 mEq/L (ref 135–145)
Total Bilirubin: 0.5 mg/dL (ref 0.2–1.2)
Total Protein: 7.1 g/dL (ref 6.0–8.3)

## 2013-10-30 LAB — VITAMIN D 25 HYDROXY (VIT D DEFICIENCY, FRACTURES): VITD: 23.36 ng/mL

## 2013-10-30 LAB — TSH: TSH: 1.93 u[IU]/mL (ref 0.35–4.50)

## 2013-10-30 MED ORDER — TRAZODONE HCL 50 MG PO TABS: 25.0000 mg | ORAL_TABLET | Freq: Every evening | ORAL | Status: DC | PRN

## 2013-10-30 MED ORDER — AMLODIPINE BESYLATE 5 MG PO TABS: ORAL_TABLET | ORAL | Status: DC

## 2013-10-30 MED ORDER — TETANUS-DIPHTH-ACELL PERTUSSIS 5-2.5-18.5 LF-MCG/0.5 IM SUSP: 0.5000 mL | Freq: Once | INTRAMUSCULAR | Status: DC

## 2013-10-30 MED ORDER — LISINOPRIL 40 MG PO TABS: 40.0000 mg | ORAL_TABLET | Freq: Every day | ORAL | Status: DC

## 2013-10-30 NOTE — Progress Notes (Signed)
Patient ID: Miranda Watkins, female   DOB: 05/14/1927, 80 y.o.   MRN: [id number]   The patient is here for annual Medicare wellness examination and management of other chronic and acute problems.  She was last seen in June 2014 and since then has undergone bilateral mastectomy for recurrent breast CA by Dr Jamal Collin.   Has been taking arimedex since May  She has been bogthered by a skin rash of both feet and forearms for the last 3 weeks,  Itchy,  Using HC OTC cream 1%  daiy for the past several weeks, with no significant improvement    The risk factors are reflected in the social history.  The roster of all physicians providing medical care to patient - is listed in the Snapshot section of the chart.  Activities of daily living:  The patient is 100% independent in all ADLs: dressing, toileting, feeding as well as independent mobility  Home safety : The patient has smoke detectors in the home. They wear seatbelts.  There are no firearms at home. There is no violence in the home.   There is no risks for hepatitis, STDs or HIV. There is no   history of blood transfusion. They have no travel history to infectious disease endemic areas of the world.  The patient has seen their dentist in the last six month. They have seen their eye doctor in the last year. They admit to slight hearing difficulty with regard to whispered voices and some television programs.  They have deferred audiologic testing in the last year.  They do not  have excessive sun exposure. Discussed the need for sun protection: hats, long sleeves and use of sunscreen if there is significant sun exposure.   Diet: the importance of a healthy diet is discussed. They do have a healthy diet.  The benefits of regular aerobic exercise were discussed. She walks 4 times per week ,  20 minutes.   Depression screen: there are no signs or vegative symptoms of depression- irritability, change in appetite, anhedonia, sadness/tearfullness.  Cognitive  assessment: the patient manages all their financial and personal affairs and is actively engaged. They could relate day,date,year and events; recalled 2/3 objects at 3 minutes; performed clock-face test normally.  The following portions of the patient's history were reviewed and updated as appropriate: allergies, current medications, past family history, past medical history,  past surgical history, past social history  and problem list.  Visual acuity was not assessed per patient preference since she has regular follow up with her ophthalmologist. Hearing and body mass index were assessed and reviewed.   During the course of the visit the patient was educated and counseled about appropriate screening and preventive services including : fall prevention , diabetes screening, nutrition counseling, colorectal cancer screening, and recommended immunizations.    Objective:  BP 130/60  Pulse 85  Temp(Src) 98 F (36.7 C) (Oral)  Ht 5\' 2"  (1.575 m)  Wt 125 lb 12 oz (57.04 kg)  BMI 22.99 kg/m2  SpO2 96%  General appearance: alert, cooperative and appears stated age Head: Normocephalic, without obvious abnormality, atraumatic Eyes: conjunctivae/corneas clear. PERRL, EOM's intact. Fundi benign. Ears: normal TM's and external ear canals both ears Nose: Nares normal. Septum midline. Mucosa normal. No drainage or sinus tenderness. Throat: lips, mucosa, and tongue normal; teeth and gums normal Neck: no adenopathy, no carotid bruit, no JVD, supple, symmetrical, trachea midline and thyroid not enlarged, symmetric, no tenderness/mass/nodules Lungs: clear to auscultation bilaterally Breasts: bilateral mastectomy scars, well  healed  Heart: regular rate and rhythm, S1, S2 normal, no murmur, click, rub or gallop Abdomen: soft, non-tender; bowel sounds normal; no masses,  no organomegaly Extremities: extremities normal, atraumatic, no cyanosis or edema Pulses: 2+ and symmetric Skin: Skin color, texture,  turgor normal. No rashes or lesions Neurologic: Alert and oriented X 3, normal strength and tone. Normal symmetric reflexes. Normal coordination and gait.   Assessment and Plan:  Hypovitaminosis D Prescribing Drisdol for level of 20  Encounter for Medicare annual wellness exam Annual comprehensive exam was done including breast, excluding pelvic and PAP smear. All screenings have been addressed . And printout was given   Hypertension Well controlled on current regimen. Renal function stable, no changes today.  Lab Results  Component Value Date   CREATININE 0.7 10/30/2013   Lab Results  Component Value Date   NA 140 10/30/2013   K 3.9 10/30/2013   CL 101 10/30/2013   CO2 29 10/30/2013     Carotid stenosis She is due for annual follow up on non critical bilateral stenosis  Insomnia due to psychological stress Trial of trazodone discussed.  Dermatitis She has some erythema and  loss of pigmentation to both feet, wonder if this could be a fixed drug reaction to arimidex.   Advised to stop the steroid cream and try benadryl ointment  Dermatology referral for skin biopsy .   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 Diagnosed in 2015, s/p bilateral mastectomy by Dr. Jamal Collin, now on Arimidex    Updated Medication List Outpatient Encounter Prescriptions as of 10/30/2013  Medication Sig  . amLODipine (NORVASC) 5 MG tablet TAKE 1 TABLET EVERY DAY  . anastrozole (ARIMIDEX) 1 MG tablet Take 1 tablet (1 mg total) by mouth daily.  . Calcium Carbonate-Vitamin D (CALCIUM + D) 600-200 MG-UNIT TABS Take by mouth.  Marland Kitchen lisinopril (PRINIVIL,ZESTRIL) 40 MG tablet Take 1 tablet (40 mg total) by mouth daily.  . [DISCONTINUED] amLODipine (NORVASC) 5 MG tablet TAKE 1 TABLET EVERY DAY  . [DISCONTINUED] lisinopril (PRINIVIL,ZESTRIL) 40 MG tablet Take 1 tablet (40 mg total) by mouth daily.  . ergocalciferol (DRISDOL) 50000 UNITS  capsule Take 1 capsule (50,000 Units total) by mouth once a week.  . Tdap (BOOSTRIX) 5-2.5-18.5 LF-MCG/0.5 injection Inject 0.5 mLs into the muscle once.  . traZODone (DESYREL) 50 MG tablet Take 0.5-1 tablets (25-50 mg total) by mouth at bedtime as needed for sleep.  . [DISCONTINUED] TDaP (BOOSTRIX) 5-2.5-18.5 LF-MCG/0.5 injection Inject 0.5 mLs into the muscle once.

## 2013-10-30 NOTE — Patient Instructions (Addendum)
You had your annual Medicare wellness exam today  You need to have a TDaP vaccine   I have given you prescription for this because it will be less costly at [facility] because Medicare will not reimburse for them.   You received the pneumonia vaccine today.  We will contact you with the bloodwork results  1) For your insomnia  Trial of trazodone for insomnia.  1/2 to 1 tablet at bedtime    2( For your foot rash  Try benadryl ointment or cream instead of hydrocortisone oand see dermatologist for consideration of a skin  Biopsy  Take allegra 180 mg daily (nonsedating antihistamine availabe without a prescription ) because I think the skin rash  may be a  reaction to arimidex   Health Maintenance, Female A healthy lifestyle and preventative care can promote health and wellness.  Maintain regular health, dental, and eye exams.  Eat a healthy diet. Foods like vegetables, fruits, whole grains, low-fat dairy products, and lean protein foods contain the nutrients you need without too many calories. Decrease your intake of foods high in solid fats, added sugars, and salt. Get information about a proper diet from your caregiver, if necessary.  Regular physical exercise is one of the most important things you can do for your health. Most adults should get at least 150 minutes of moderate-intensity exercise (any activity that increases your heart rate and causes you to sweat) each week. In addition, most adults need muscle-strengthening exercises on 2 or more days a week.   Maintain a healthy weight. The body mass index (BMI) is a screening tool to identify possible weight problems. It provides an estimate of body fat based on height and weight. Your caregiver can help determine your BMI, and can help you achieve or maintain a healthy weight. For adults 20 years and older:  A BMI below 18.5 is considered underweight.  A BMI of 18.5 to 24.9 is normal.  A BMI of 25 to 29.9 is considered  overweight.  A BMI of 30 and above is considered obese.  Maintain normal blood lipids and cholesterol by exercising and minimizing your intake of saturated fat. Eat a balanced diet with plenty of fruits and vegetables. Blood tests for lipids and cholesterol should begin at age 13 and be repeated every 5 years. If your lipid or cholesterol levels are high, you are over 50, or you are a high risk for heart disease, you may need your cholesterol levels checked more frequently.Ongoing high lipid and cholesterol levels should be treated with medicines if diet and exercise are not effective.  If you smoke, find out from your caregiver how to quit. If you do not use tobacco, do not start.  Lung cancer screening is recommended for adults aged 81-80 years who are at high risk for developing lung cancer because of a history of smoking. Yearly low-dose computed tomography (CT) is recommended for people who have at least a 30-pack-year history of smoking and are a current smoker or have quit within the past 15 years. A pack year of smoking is smoking an average of 1 pack of cigarettes a day for 1 year (for example: 1 pack a day for 30 years or 2 packs a day for 15 years). Yearly screening should continue until the smoker has stopped smoking for at least 15 years. Yearly screening should also be stopped for people who develop a health problem that would prevent them from having lung cancer treatment.  If you are pregnant, do  not drink alcohol. If you are breastfeeding, be very cautious about drinking alcohol. If you are not pregnant and choose to drink alcohol, do not exceed 1 drink per day. One drink is considered to be 12 ounces (355 mL) of beer, 5 ounces (148 mL) of wine, or 1.5 ounces (44 mL) of liquor.  Avoid use of street drugs. Do not share needles with anyone. Ask for help if you need support or instructions about stopping the use of drugs.  High blood pressure causes heart disease and increases the risk  of stroke. Blood pressure should be checked at least every 1 to 2 years. Ongoing high blood pressure should be treated with medicines, if weight loss and exercise are not effective.  If you are 74 to 78 years old, ask your caregiver if you should take aspirin to prevent strokes.  Diabetes screening involves taking a blood sample to check your fasting blood sugar level. This should be done once every 3 years, after age 40, if you are within normal weight and without risk factors for diabetes. Testing should be considered at a younger age or be carried out more frequently if you are overweight and have at least 1 risk factor for diabetes.  Breast cancer screening is essential preventative care for women. You should practice "breast self-awareness." This means understanding the normal appearance and feel of your breasts and may include breast self-examination. Any changes detected, no matter how small, should be reported to a caregiver. Women in their 47s and 30s should have a clinical breast exam (CBE) by a caregiver as part of a regular health exam every 1 to 3 years. After age 49, women should have a CBE every year. Starting at age 20, women should consider having a mammogram (breast X-ray) every year. Women who have a family history of breast cancer should talk to their caregiver about genetic screening. Women at a high risk of breast cancer should talk to their caregiver about having an MRI and a mammogram every year.  Breast cancer gene (BRCA)-related cancer risk assessment is recommended for women who have family members with BRCA-related cancers. BRCA-related cancers include breast, ovarian, tubal, and peritoneal cancers. Having family members with these cancers may be associated with an increased risk for harmful changes (mutations) in the breast cancer genes BRCA1 and BRCA2. Results of the assessment will determine the need for genetic counseling and BRCA1 and BRCA2 testing.  The Pap test is a  screening test for cervical cancer. Women should have a Pap test starting at age 14. Between ages 80 and 45, Pap tests should be repeated every 2 years. Beginning at age 42, you should have a Pap test every 3 years as long as the past 3 Pap tests have been normal. If you had a hysterectomy for a problem that was not cancer or a condition that could lead to cancer, then you no longer need Pap tests. If you are between ages 73 and 35, and you have had normal Pap tests going back 10 years, you no longer need Pap tests. If you have had past treatment for cervical cancer or a condition that could lead to cancer, you need Pap tests and screening for cancer for at least 20 years after your treatment. If Pap tests have been discontinued, risk factors (such as a new sexual partner) need to be reassessed to determine if screening should be resumed. Some women have medical problems that increase the chance of getting cervical cancer. In these cases, your  caregiver may recommend more frequent screening and Pap tests.  The human papillomavirus (HPV) test is an additional test that may be used for cervical cancer screening. The HPV test looks for the virus that can cause the cell changes on the cervix. The cells collected during the Pap test can be tested for HPV. The HPV test could be used to screen women aged 62 years and older, and should be used in women of any age who have unclear Pap test results. After the age of 28, women should have HPV testing at the same frequency as a Pap test.  Colorectal cancer can be detected and often prevented. Most routine colorectal cancer screening begins at the age of 4 and continues through age 9. However, your caregiver may recommend screening at an earlier age if you have risk factors for colon cancer. On a yearly basis, your caregiver may provide home test kits to check for hidden blood in the stool. Use of a small camera at the end of a tube, to directly examine the colon  (sigmoidoscopy or colonoscopy), can detect the earliest forms of colorectal cancer. Talk to your caregiver about this at age 65, when routine screening begins. Direct examination of the colon should be repeated every 5 to 10 years through age 64, unless early forms of pre-cancerous polyps or small growths are found.  Hepatitis C blood testing is recommended for all people born from [birth year] through 1963 and any individual with known risks for hepatitis C.  Practice safe sex. Use condoms and avoid high-risk sexual practices to reduce the spread of sexually transmitted infections (STIs). Sexually active women aged 38 and younger should be checked for Chlamydia, which is a common sexually transmitted infection. Older women with new or multiple partners should also be tested for Chlamydia. Testing for other STIs is recommended if you are sexually active and at increased risk.  Osteoporosis is a disease in which the bones lose minerals and strength with aging. This can result in serious bone fractures. The risk of osteoporosis can be identified using a bone density scan. Women ages 51 and over and women at risk for fractures or osteoporosis should discuss screening with their caregivers. Ask your caregiver whether you should be taking a calcium supplement or vitamin D to reduce the rate of osteoporosis.  Menopause can be associated with physical symptoms and risks. Hormone replacement therapy is available to decrease symptoms and risks. You should talk to your caregiver about whether hormone replacement therapy is right for you.  Use sunscreen. Apply sunscreen liberally and repeatedly throughout the day. You should seek shade when your shadow is shorter than you. Protect yourself by wearing long sleeves, pants, a wide-brimmed hat, and sunglasses year round, whenever you are outdoors.  Notify your caregiver of new moles or changes in moles, especially if there is a change in shape or color. Also notify your  caregiver if a mole is larger than the size of a pencil eraser.  Stay current with your immunizations. Document Released: 10/18/2010 Document Revised: 07/30/2012 Document Reviewed: 03/06/2013 [facility] Patient Information 2015 [place], [place]. This information is not intended to replace advice given to you by your health care provider. Make sure you discuss any questions you have with your health care provider.

## 2013-10-30 NOTE — Progress Notes (Signed)
Pre visit review using our clinic review tool, if applicable. No additional management support is needed unless otherwise documented below in the visit note. 

## 2013-10-31 ENCOUNTER — Telehealth: Payer: Self-pay | Admitting: *Deleted

## 2013-10-31 NOTE — Telephone Encounter (Signed)
She called to say she has noticed a ithcing rash on her feet, legs and back, "generalized". She has talked with Dr Derrel Nip and they feel it maybe due to the Arimidex. She will use benadryl to help with the itching rash. The rash started after she started the Arimidex in June. She will stop taking it until further notice. [place] and will call back and see what suggestions that Dr Jamal Collin has for her next week.

## 2013-11-01 ENCOUNTER — Encounter: Payer: Self-pay | Admitting: *Deleted

## 2013-11-01 DIAGNOSIS — E559 Vitamin D deficiency, unspecified: Secondary | ICD-10-CM | POA: Insufficient documentation

## 2013-11-01 MED ORDER — ERGOCALCIFEROL 1.25 MG (50000 UT) PO CAPS: 50000.0000 [IU] | ORAL_CAPSULE | ORAL | Status: DC

## 2013-11-02 ENCOUNTER — Encounter: Payer: Self-pay | Admitting: Internal Medicine

## 2013-11-02 DIAGNOSIS — F5102 Adjustment insomnia: Secondary | ICD-10-CM | POA: Insufficient documentation

## 2013-11-02 DIAGNOSIS — L309 Dermatitis, unspecified: Secondary | ICD-10-CM | POA: Insufficient documentation

## 2013-11-02 NOTE — Assessment & Plan Note (Signed)
Prescribing Drisdol for level of 20

## 2013-11-02 NOTE — Assessment & Plan Note (Signed)
Well controlled on current regimen. Renal function stable, no changes today.  Lab Results  Component Value Date   CREATININE 0.7 10/30/2013   Lab Results  Component Value Date   NA 140 10/30/2013   K 3.9 10/30/2013   CL 101 10/30/2013   CO2 29 10/30/2013

## 2013-11-02 NOTE — Assessment & Plan Note (Addendum)
She has some erythema and  loss of pigmentation to both feet, wonder if this could be a fixed drug reaction to arimidex.   Advised to stop the steroid cream and try benadryl ointment  Dermatology referral for skin biopsy .

## 2013-11-02 NOTE — Assessment & Plan Note (Addendum)
Diagnosed in 2015, s/p bilateral mastectomy by Dr. Sankar, now on Arimidex 

## 2013-11-02 NOTE — Assessment & Plan Note (Signed)
Trial of trazodone discussed.

## 2013-11-02 NOTE — Assessment & Plan Note (Signed)
She is due for annual follow up on non critical bilateral stenosis

## 2013-11-02 NOTE — Assessment & Plan Note (Signed)
Annual comprehensive exam was done including breast, excluding pelvic and PAP smear. All screenings have been addressed . And printout was given

## 2013-11-07 ENCOUNTER — Telehealth: Payer: Self-pay | Admitting: *Deleted

## 2013-11-07 NOTE — Telephone Encounter (Signed)
She called to say that she still has a rash but it is not itching as bed. She will not the Arimidex for now and call back once rash has completely healed. We will then ask Dr. Jamal Collin what to do next/or what medication to try.

## 2013-12-04 ENCOUNTER — Encounter: Payer: Self-pay | Admitting: General Surgery

## 2013-12-18 ENCOUNTER — Encounter: Payer: Self-pay | Admitting: General Surgery

## 2013-12-18 ENCOUNTER — Ambulatory Visit (INDEPENDENT_AMBULATORY_CARE_PROVIDER_SITE_OTHER): Payer: Self-pay | Admitting: General Surgery

## 2013-12-18 VITALS — BP 120/68 | HR 64 | Resp 12 | Ht 61.0 in | Wt 128.0 lb

## 2013-12-18 DIAGNOSIS — C50912 Malignant neoplasm of unspecified site of left female breast: Secondary | ICD-10-CM

## 2013-12-18 DIAGNOSIS — C50919 Malignant neoplasm of unspecified site of unspecified female breast: Secondary | ICD-10-CM

## 2013-12-18 NOTE — Progress Notes (Signed)
Patient ID: Miranda Watkins, female   DOB: 04-17-1928, 80 y.o.   MRN: [id number]  Chief Complaint  Patient presents with  . Follow-up    2 month follow up breast cancer    HPI Miranda Watkins is a 80 y.o. female who presents for a 2 month follow up breast cancer. The patient is doing well. No complaints at this time. Patient underwent bilateral mastectomy and denies any symptoms.   HPI  Past Medical History  Diagnosis Date  . Hypertension   . Diverticulosis of colon   . Heart murmur 07/02/2003  . Breast cancer     Past Surgical History  Procedure Laterality Date  . Cataract extraction, bilateral      Tobe Sos,  remote,   . Tonsillectomy  1998  . Cholecystectomy  1996  . Colonoscopy  2009    Dr. Vira Agar  . Basal cell carcinoma excision  2014    face  . Breast surgery      bilateral 1991, and 2000  . Breast surgery Bilateral 09/06/13    bilateral mastectomy/tumor size is 1.8cm, SN neg. This is T1c,N0, M0.  . Mastectomy, radical Bilateral May 2015    Family History  Problem Relation Age of Onset  . Hypertension Mother   . Cancer Father     lung cancer, smoker  . Hypertension Sister     Social History History  Substance Use Topics  . Smoking status: Former Smoker -- 40 years    Types: Cigarettes    Quit date: 08/15/1983  . Smokeless tobacco: Never Used  . Alcohol Use: 4.2 oz/week    7 Glasses of wine per week    Allergies  Allergen Reactions  . Arimidex [Anastrozole] Rash    Current Outpatient Prescriptions  Medication Sig Dispense Refill  . amLODipine (NORVASC) 5 MG tablet TAKE 1 TABLET EVERY DAY  90 tablet  1  . Calcium Carbonate-Vitamin D (CALCIUM + D) 600-200 MG-UNIT TABS Take by mouth.      . ergocalciferol (DRISDOL) 50000 UNITS capsule Take 1 capsule (50,000 Units total) by mouth once a week.  12 capsule  0  . lisinopril (PRINIVIL,ZESTRIL) 40 MG tablet Take 1 tablet (40 mg total) by mouth daily.  90 tablet  3  . traZODone (DESYREL) 50 MG tablet Take  0.5-1 tablets (25-50 mg total) by mouth at bedtime as needed for sleep.  30 tablet  3   No current facility-administered medications for this visit.    Review of Systems Review of Systems  Constitutional: Negative.   Respiratory: Negative.   Cardiovascular: Negative.     Blood pressure 120/68, pulse 64, resp. rate 12, height 5\' 1"  (1.549 m), weight 128 lb (58.06 kg).  Physical Exam Physical Exam  Constitutional: She is oriented to person, place, and time. She appears well-developed and well-nourished.  Neck: Neck supple. No thyromegaly present.  Pulmonary/Chest:  Bilateral mastectomy sites well healed with no signs of reoccurrence.   Lymphadenopathy:    She has no cervical adenopathy.    She has no axillary adenopathy.  Neurological: She is alert and oriented to person, place, and time.  Skin: Skin is warm and dry.    Data Reviewed    Assessment    Recent left breast cancer, prior bilateral breast CA. She tested low on oncotype and so chemo was not indicated.  Pt had problems of skin rash with Arimidex. At this point plan to follow every 81mos for 2 yrs and then yearly with exam and CA  27-29    Plan    As above        Criss Alvine F 12/18/2013, 10:06 AM

## 2013-12-18 NOTE — Patient Instructions (Signed)
Patient to return in 3 months for follow up. The patient is aware to call back for any questions or concerns.  

## 2013-12-19 ENCOUNTER — Telehealth: Payer: Self-pay | Admitting: *Deleted

## 2013-12-19 LAB — CANCER ANTIGEN 27.29: CA 27.29: 33 U/mL (ref 0.0–38.6)

## 2013-12-19 LAB — CEA: CEA: 1.9 ng/mL (ref 0.0–4.7)

## 2013-12-19 NOTE — Telephone Encounter (Signed)
Notified patient as instructed, patient pleased. Discussed follow-up appointments, patient agrees  

## 2013-12-19 NOTE — Progress Notes (Signed)
Quick Note:  Inform pt labs are normal. F/u as scheduled ______ 

## 2013-12-19 NOTE — Telephone Encounter (Signed)
Message copied by Miranda Watkins on Thu Dec 19, 2013  1:09 PM ------      Message from: Christene Lye      Created: Thu Dec 19, 2013 12:02 PM       Inform pt labs are normal. F/u as scheduled ------

## 2014-02-17 ENCOUNTER — Encounter: Payer: Self-pay | Admitting: General Surgery

## 2014-03-05 ENCOUNTER — Telehealth: Payer: Self-pay

## 2014-03-05 NOTE — Telephone Encounter (Signed)
LVM for pt to call back  RE: AWV for 2015 scheduled with Morey Hummingbird if possible.

## 2014-03-06 NOTE — Telephone Encounter (Signed)
Pt scheduled for December 9th, thanks!

## 2014-03-11 NOTE — Telephone Encounter (Signed)
Patient called back and refused appt.

## 2014-03-19 ENCOUNTER — Encounter: Payer: Self-pay | Admitting: General Surgery

## 2014-03-19 ENCOUNTER — Ambulatory Visit (INDEPENDENT_AMBULATORY_CARE_PROVIDER_SITE_OTHER): Payer: Medicare Other | Admitting: General Surgery

## 2014-03-19 VITALS — BP 122/60 | HR 68 | Resp 14 | Ht 61.0 in | Wt 128.0 lb

## 2014-03-19 DIAGNOSIS — C50912 Malignant neoplasm of unspecified site of left female breast: Secondary | ICD-10-CM

## 2014-03-19 NOTE — Progress Notes (Signed)
Patient ID: VENERA PRIVOTT, female   DOB: 1928/01/11, 80 y.o.   MRN: [id number]  Chief Complaint  Patient presents with  . Follow-up    breast cancer    HPI Miranda Watkins is a 80 y.o. female who presents for a 3 month follow up breast cancer. The patient is doing well. No complaints at this time. Patient underwent bilateral mastectomy and denies any symptoms. The last cancer was a 1.8cm , N0,M0. She did not tolerate antihormonal therapy.  HPI  Past Medical History  Diagnosis Date  . Hypertension   . Diverticulosis of colon   . Heart murmur 07/02/2003  . Breast cancer     Past Surgical History  Procedure Laterality Date  . Cataract extraction, bilateral      Tobe Sos,  remote,   . Tonsillectomy  1998  . Cholecystectomy  1996  . Colonoscopy  2009    Dr. Vira Agar  . Basal cell carcinoma excision  2014    face  . Breast surgery      bilateral 1991, and 2000  . Breast surgery Bilateral 09/06/13    bilateral mastectomy/tumor size is 1.8cm, SN neg. This is T1c,N0, M0.  . Mastectomy, radical Bilateral May 2015    Family History  Problem Relation Age of Onset  . Hypertension Mother   . Cancer Father     lung cancer, smoker  . Hypertension Sister     Social History History  Substance Use Topics  . Smoking status: Former Smoker -- 40 years    Types: Cigarettes    Quit date: 08/15/1983  . Smokeless tobacco: Never Used  . Alcohol Use: 4.2 oz/week    7 Glasses of wine per week    Allergies  Allergen Reactions  . Arimidex [Anastrozole] Rash    Current Outpatient Prescriptions  Medication Sig Dispense Refill  . amLODipine (NORVASC) 5 MG tablet TAKE 1 TABLET EVERY DAY 90 tablet 1  . Calcium Carbonate-Vitamin D (CALCIUM + D) 600-200 MG-UNIT TABS Take by mouth.    . ergocalciferol (DRISDOL) 50000 UNITS capsule Take 1 capsule (50,000 Units total) by mouth once a week. 12 capsule 0  . lisinopril (PRINIVIL,ZESTRIL) 40 MG tablet Take 1 tablet (40 mg total) by mouth daily. 90  tablet 3  . traZODone (DESYREL) 50 MG tablet Take 0.5-1 tablets (25-50 mg total) by mouth at bedtime as needed for sleep. 30 tablet 3  . triamcinolone cream (KENALOG) 0.1 %      No current facility-administered medications for this visit.    Review of Systems Review of Systems  Constitutional: Negative.   Respiratory: Negative.   Cardiovascular: Negative.     Blood pressure 122/60, pulse 68, resp. rate 14, height 5\' 1"  (1.549 m), weight 128 lb (58.06 kg).  Physical Exam Physical Exam  Constitutional: She is oriented to person, place, and time. She appears well-developed and well-nourished.  Eyes: Conjunctivae are normal. No scleral icterus.  Neck: Neck supple.  Cardiovascular: Normal rate, regular rhythm and normal heart sounds.   Pulmonary/Chest: Effort normal and breath sounds normal. She exhibits no mass.  B/l mastectomy. Incisions well healed. No signs of any local recurrence.  Abdominal: Soft. Bowel sounds are normal. There is no tenderness.  Lymphadenopathy:    She has no cervical adenopathy.    She has no axillary adenopathy.  Neurological: She is alert and oriented to person, place, and time.  Skin: Skin is warm and dry.    Data Reviewed Notes reviewed  Assessment  Stable exam. Bilateral breast cancers. No evidence of metastatic disease.     Plan   CA 27.29 ordered Patient to return in 6 months.       Sinda Leedom G 03/19/2014, 10:30 AM

## 2014-03-19 NOTE — Patient Instructions (Addendum)
Return in 6 months  Call for any new problems.

## 2014-03-20 ENCOUNTER — Telehealth: Payer: Self-pay | Admitting: *Deleted

## 2014-03-20 LAB — CANCER ANTIGEN 27.29: CA 27.29: 33.9 U/mL (ref 0.0–38.6)

## 2014-03-20 NOTE — Progress Notes (Signed)
Quick Note:  Inform pt labs are normal. F/u as scheduled ______ 

## 2014-03-20 NOTE — Telephone Encounter (Signed)
Notified patient as instructed, patient pleased. Discussed follow-up appointments, patient agrees  

## 2014-03-20 NOTE — Telephone Encounter (Signed)
-----   Message from Christene Lye, MD sent at 03/20/2014  8:33 AM EST ----- Inform pt labs are normal. F/u as scheduled

## 2014-03-26 ENCOUNTER — Encounter: Payer: Medicare Other | Admitting: Nurse Practitioner

## 2014-05-02 ENCOUNTER — Encounter: Payer: Self-pay | Admitting: Internal Medicine

## 2014-05-02 ENCOUNTER — Ambulatory Visit (INDEPENDENT_AMBULATORY_CARE_PROVIDER_SITE_OTHER): Payer: Medicare Other | Admitting: Internal Medicine

## 2014-05-02 VITALS — BP 126/60 | HR 73 | Temp 97.7°F | Resp 14 | Ht 61.0 in | Wt 130.5 lb

## 2014-05-02 DIAGNOSIS — Z79899 Other long term (current) drug therapy: Secondary | ICD-10-CM

## 2014-05-02 DIAGNOSIS — I6523 Occlusion and stenosis of bilateral carotid arteries: Secondary | ICD-10-CM

## 2014-05-02 DIAGNOSIS — K5730 Diverticulosis of large intestine without perforation or abscess without bleeding: Secondary | ICD-10-CM

## 2014-05-02 DIAGNOSIS — F5102 Adjustment insomnia: Secondary | ICD-10-CM

## 2014-05-02 DIAGNOSIS — I10 Essential (primary) hypertension: Secondary | ICD-10-CM

## 2014-05-02 LAB — COMPREHENSIVE METABOLIC PANEL
ALK PHOS: 70 U/L (ref 39–117)
ALT: 19 U/L (ref 0–35)
AST: 21 U/L (ref 0–37)
Albumin: 4.1 g/dL (ref 3.5–5.2)
BUN: 15 mg/dL (ref 6–23)
CALCIUM: 10.6 mg/dL — AB (ref 8.4–10.5)
CHLORIDE: 102 meq/L (ref 96–112)
CO2: 30 mEq/L (ref 19–32)
Creatinine, Ser: 0.78 mg/dL (ref 0.40–1.20)
GFR: 74.62 mL/min (ref >60.00)
GLUCOSE: 99 mg/dL (ref 70–99)
Potassium: 4.3 mEq/L (ref 3.5–5.1)
Sodium: 140 mEq/L (ref 135–145)
TOTAL PROTEIN: 7 g/dL (ref 6.0–8.3)
Total Bilirubin: 0.5 mg/dL (ref 0.2–1.2)

## 2014-05-02 NOTE — Assessment & Plan Note (Addendum)
With right carotid bruit on eam.  The carotid ultrasound she had done in June showed bilateral disease but not significant to warrant surgery . 1-49% (Schnier)Needs follow up, last ultrasound was done in  2014.  Referral made   

## 2014-05-02 NOTE — Assessment & Plan Note (Addendum)
She has chronic early morning wakneing lasting < 1 hr,  With no subseuqent complaints of  daytime sleepiness   Or early morning fatigue  .  Advised to reduce evening caffeine and liquids,  Try reading ,  Continue trazodone

## 2014-05-02 NOTE — Progress Notes (Signed)
Patient ID: Miranda Watkins, female   DOB: 01-Jul-1927, 81 y.o.   MRN: [id number]   Patient Active Problem List   Diagnosis Date Noted  . Insomnia due to psychological stress 11/02/2013  . Dermatitis 11/02/2013  . Hypovitaminosis D 11/01/2013  . 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 08/28/2013  .  bilatteral, right in 1991, left 2000, bilateral lumpectomy, right AD, bil radiation 08/29/2012  . Hypertension 08/15/2011  . Carotid stenosis 08/15/2011  . Encounter for Medicare annual wellness exam 08/15/2011  . Diverticulosis of colon     Subjective:  CC:   Chief Complaint  Patient presents with  . Follow-up    Blood pressure and check up    HPI:   Miranda Watkins is a 81 y.o. female who presents for   Follow up on chronic issues including hypertension and history of breast cancer.   She feels generally well, has no complaints.  Tolerating her medications without side effects . Participates  In regular low impact exercise an average of 3 times per week.  No history of falls in the last year.  Sleeping well.  Bowels  moving regularly.   Past Medical History  Diagnosis Date  . Hypertension   . Diverticulosis of colon   . Heart murmur 07/02/2003  . Breast cancer     Past Surgical History  Procedure Laterality Date  . Cataract extraction, bilateral      Tobe Sos,  remote,   . Tonsillectomy  1998  . Cholecystectomy  1996  . Colonoscopy  2009    Dr. Vira Agar  . Basal cell carcinoma excision  2014    face  . Breast surgery      bilateral 1991, and 2000  . Breast surgery Bilateral 09/06/13    bilateral mastectomy/tumor size is 1.8cm, SN neg. This is T1c,N0, M0.  . Mastectomy, radical Bilateral May 2015       The following portions of the patient's history were reviewed and updated as appropriate: Allergies, current medications, and problem list.    Review of Systems:   Patient denies headache,  fevers, malaise, unintentional weight loss, skin rash, eye pain, sinus congestion and sinus pain, sore throat, dysphagia,  hemoptysis , cough, dyspnea, wheezing, chest pain, palpitations, orthopnea, edema, abdominal pain, nausea, melena, diarrhea, constipation, flank pain, dysuria, hematuria, urinary  Frequency, nocturia, numbness, tingling, seizures,  Focal weakness, Loss of consciousness,  Tremor, insomnia, depression, anxiety, and suicidal ideation.     History   Social History  . Marital Status: Widowed    Spouse Name: N/A    Number of Children: N/A  . Years of Education: N/A   Occupational History  . Not on file.   Social History Main Topics  . Smoking status: Former Smoker -- 40 years    Types: Cigarettes    Quit date: 08/15/1983  . Smokeless tobacco: Never Used  . Alcohol Use: 4.2 oz/week    7 Glasses of wine per week  . Drug Use: No  . Sexual Activity: Not on file   Other Topics Concern  . 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 ROM normal. No CVA tenderness. Lungs: clear to auscultation bilaterally Heart: regular rate and rhythm, S1, S2 normal, no murmur, click, rub or gallop Abdomen: soft, non-tender; bowel sounds normal; no masses,  no organomegaly Pulses: 2+ and symmetric Skin: Skin color, texture, turgor normal. No rashes or lesions Lymph nodes: Cervical, supraclavicular, and axillary nodes normal.  Assessment and Plan:  Carotid stenosis With right carotid  bruit on eam.  The carotid ultrasound she had done in June showed bilateral disease but not significant to warrant surgery . 1-49% (Schnier)Needs follow up, last ultrasound was done in  2014.  Referral made     Insomnia due to psychological stress She has chronic early morning wakneing lasting < 1 hr,  With no subseuqent complaints of  daytime sleepiness   Or early morning fatigue  .  Advised to reduce evening caffeine and liquids,  Try reading ,  Continue trazodone   Hypertension Well controlled on current regimen. Renal function stable, no changes today.  Lab Results  Component Value Date   NA 140 05/02/2014   K 4.3 05/02/2014   CL 102 05/02/2014   CO2 30 05/02/2014   Lab Results  Component Value Date   CREATININE 0.78 05/02/2014      Diverticulosis of colon Managed with high fiber diet.  No recent episodes of inflammation.     Updated Medication List Outpatient Encounter Prescriptions as of 05/02/2014  Medication Sig  . amLODipine (NORVASC) 5 MG tablet TAKE 1 TABLET EVERY DAY  . Calcium Carbonate-Vitamin D (CALCIUM + D) 600-200 MG-UNIT TABS Take by mouth.  Marland Kitchen lisinopril (PRINIVIL,ZESTRIL) 40 MG tablet Take 1 tablet (40 mg total) by mouth daily.  . traZODone (DESYREL) 50 MG tablet Take 0.5-1 tablets (25-50 mg total) by mouth at bedtime as needed for sleep.  . [DISCONTINUED] ergocalciferol (DRISDOL) 50000 UNITS capsule Take 1 capsule (50,000 Units total) by mouth once a week. (Patient not taking: Reported on 05/02/2014)  . [DISCONTINUED] triamcinolone cream (KENALOG) 0.1 %      Orders Placed This Encounter  Procedures  . Comprehensive metabolic panel  . Ambulatory referral to Vascular Surgery    Return in about 6 months (around 10/31/2014).

## 2014-05-02 NOTE — Patient Instructions (Addendum)
You are doing well.  Continue your current medications.  I have ordered a follow up ultrasound of your carotid arteries to check on the stenosis that was seen in 2014.  If it has progressed, we will discuss adding cholesterol medication to stabilize the placque      recommend getting the half of your calcium and Vitamin D  Requirements through dietary sources rather than supplements given the recent association of calcium supplements with increased coronary artery calcium scores (You need 1800 mg daily )   Unsweetened almond/coconut milk, Cashew and soy  Milks are all great low calorie low carb, cholesterol free sources of dietary calcium and vitamin D.      Please continue your twice daily probiotic ( Align, Floraque or Culturelle) while you are on the antibiotic to prevent a serious antibiotic associated diarrhea  Called clostridium dificile colitis

## 2014-05-02 NOTE — Assessment & Plan Note (Addendum)
Well controlled on current regimen. Renal function stable, no changes today.  Lab Results  Component Value Date   NA 140 05/02/2014   K 4.3 05/02/2014   CL 102 05/02/2014   CO2 30 05/02/2014   Lab Results  Component Value Date   CREATININE 0.78 05/02/2014

## 2014-05-02 NOTE — Progress Notes (Signed)
Pre-visit discussion using our clinic review tool. No additional management support is needed unless otherwise documented below in the visit note.  

## 2014-05-04 NOTE — Assessment & Plan Note (Signed)
Managed with high fiber diet.  No recent episodes of inflammation.

## 2014-05-05 ENCOUNTER — Other Ambulatory Visit: Payer: Self-pay | Admitting: Internal Medicine

## 2014-05-05 ENCOUNTER — Encounter: Payer: Self-pay | Admitting: *Deleted

## 2014-05-05 ENCOUNTER — Encounter: Payer: Self-pay | Admitting: Internal Medicine

## 2014-05-05 ENCOUNTER — Telehealth: Payer: Self-pay | Admitting: Internal Medicine

## 2014-05-05 NOTE — Telephone Encounter (Signed)
emmi mailed  °

## 2014-08-09 NOTE — Op Note (Signed)
PATIENT NAME:  Miranda Watkins, Miranda Watkins MR#:  [id number] DATE OF BIRTH:  October 27, 1927  DATE OF PROCEDURE: 09/06/13.   PREOPERATIVE DIAGNOSES:  1.  Carcinoma, left breast, new primary.  2.  History of right breast cancer.   POSTOPERATIVE DIAGNOSES: 1.  Carcinoma, left breast, new primary.  2.  History of right breast cancer.   OPERATION:  1.  Left total mastectomy with sentinel node biopsy.  2.  Right total mastectomy.   SURGEON: S.G. Jamal Collin, M.D.   ANESTHESIA: General.   COMPLICATIONS: None.   ESTIMATED BLOOD LOSS: Approximately 50 mL.   DRAINS: Blake drain, one on each side.   DESCRIPTION OF PROCEDURE: The patient was put to sleep in the supine position on the operating table. Both breasts and the left axilla were prepped and draped out as a sterile field. The patient had nuclear contrast injection, but had very little the way of activity in the axilla on the left side. Additional methylene blue diluted 50% was injected in the subareolar prior to prepping. Timeout was performed. Elliptical skin incisions were mapped out for both sides and marked on the skin. Left total mastectomy and node biopsy was performed first. Incision was made along the upper pole portion of the planned incision. The superior flap was then created towards the axilla and the axillary contents explored. Gamma finder showed only some miniscule activity in one spot, but no defined lymph node was identified. This fatty tissue here was about 2 cm in size. This was freed and excised out and sent to pathology. A single a small node was identified, which was mostly fat replaced and was grossly negative for metastatic macro metastases. The rest of the axilla showed no visible or palpable lymph nodes.   The superior flap was then completed all the way to the infraclavicular region and the inferior flap was then created down to the upper rectus fascia. The breast along the pectoral fascia was dissected off from the pectoral muscle from  the medial to the lateral aspect. Lateral attachments were taken down. Cautery was used along with some ligature of 3-0 Vicryl. The lateral end of the breast was tagged for orientation and sent to pathology. The wound was irrigated. A Blake drain was positioned and brought out through a stab incision inferiorly and fastened to the skin with nylon stitch. The subcutaneous tissue incorporating a layer with subcuticular tissue was approximated with 2 running sutures of 2-0 Vicryl. Following this, the right total mastectomy was performed. An incision was made along the marked area and superior and inferior flaps were then created to the infraclavicular and the upper rectus fascia respectively. The breast tissue along the pectoral fascia was dissected off from the underlying muscle from medial to lateral and removed. The lateral end was tagged for identification. The wound was irrigated. A Blake drain was positioned similar to the left side. The subcutaneous tissue was approximated with a running 2-0 Vicryl stitch, 2 of them were used. Following this, both incisions were covered with the skin approximated using the Dermabond tape and secured in place. The mastectomy sites were patterned with some fluffs and ABD and covered with a surgical bra. The procedure was well tolerated. She was subsequently extubated and returned to the recovery room in stable condition.   ____________________________ S.Robinette Haines, MD sgs:aw D: 09/09/2013 08:31:00 ET T: 09/09/2013 08:55:45 ET JOB#: [id number]  cc: S.G. Jamal Collin, MD, <Dictator> [facility] Robinette Haines MD ELECTRONICALLY SIGNED 09/10/2013 9:25

## 2014-08-20 ENCOUNTER — Other Ambulatory Visit: Payer: Self-pay

## 2014-08-20 DIAGNOSIS — C50412 Malignant neoplasm of upper-outer quadrant of left female breast: Secondary | ICD-10-CM

## 2014-09-25 LAB — CANCER ANTIGEN 27.29: CA 27.29: 45.2 U/mL — AB (ref 0.0–38.6)

## 2014-09-29 ENCOUNTER — Encounter: Payer: Self-pay | Admitting: General Surgery

## 2014-09-29 ENCOUNTER — Ambulatory Visit (INDEPENDENT_AMBULATORY_CARE_PROVIDER_SITE_OTHER): Payer: Medicare Other | Admitting: General Surgery

## 2014-09-29 VITALS — BP 138/70 | HR 82 | Resp 14 | Ht 61.0 in | Wt 129.4 lb

## 2014-09-29 DIAGNOSIS — Z853 Personal history of malignant neoplasm of breast: Secondary | ICD-10-CM | POA: Diagnosis not present

## 2014-09-29 NOTE — Progress Notes (Signed)
Patient ID: Miranda Watkins, female   DOB: [birth year], 81 y.o.   MRN: [id number]  Chief Complaint  Patient presents with  . Follow-up    HPI Miranda Watkins is a 81 y.o. female who presents for breast cancer follow up. The patient is doing well. No complaints at this time. Patient underwent bilateral mastectomy on 09/06/13 and denies any symptoms.    HPI  Past Medical History  Diagnosis Date  . Hypertension   . Diverticulosis of colon   . Heart murmur 07/02/2003  . Breast cancer 08/2013    Left, 1.8cm , N0,M0    Past Surgical History  Procedure Laterality Date  . Cataract extraction, bilateral      Tobe Sos,  remote,   . Tonsillectomy  1998  . Cholecystectomy  1996  . Colonoscopy  2009    Dr. Vira Agar  . Basal cell carcinoma excision  2014    face  . Breast surgery      bilateral 1991, and 2000  . Breast surgery Bilateral 09/06/13    bilateral mastectomy/tumor size is 1.8cm, SN neg. This is T1c,N0, M0.  . Mastectomy, radical Bilateral May 2015    Family History  Problem Relation Age of Onset  . Hypertension Mother   . Cancer Father     lung cancer, smoker  . Hypertension Sister     Social History History  Substance Use Topics  . Smoking status: Former Smoker -- 40 years    Types: Cigarettes    Quit date: 08/15/1983  . Smokeless tobacco: Never Used  . Alcohol Use: 4.2 oz/week    7 Glasses of wine per week    Allergies  Allergen Reactions  . Arimidex [Anastrozole] Rash    Current Outpatient Prescriptions  Medication Sig Dispense Refill  . amLODipine (NORVASC) 5 MG tablet TAKE 1 TABLET EVERY DAY 90 tablet 1  . Calcium Carbonate-Vitamin D (CALCIUM + D) 600-200 MG-UNIT TABS Take by mouth.    Marland Kitchen lisinopril (PRINIVIL,ZESTRIL) 40 MG tablet Take 1 tablet (40 mg total) by mouth daily. 90 tablet 3  . traZODone (DESYREL) 50 MG tablet Take 0.5-1 tablets (25-50 mg total) by mouth at bedtime as needed for sleep. 30 tablet 3   No current facility-administered medications  for this visit.    Review of Systems Review of Systems  Constitutional: Negative.   Respiratory: Negative.   Cardiovascular: Negative.     Blood pressure 138/70, pulse 82, resp. rate 14, height 5\' 1"  (1.549 m), weight 129 lb 6.4 oz (58.695 kg).  Physical Exam Physical Exam  Constitutional: She is oriented to person, place, and time. She appears well-developed and well-nourished.  Eyes: No scleral icterus.  Neck: Neck supple.  Cardiovascular: Normal rate, regular rhythm and normal heart sounds.   Pulmonary/Chest: Effort normal and breath sounds normal.  Bilateral mastectomy sites are well healed. No findings suggestive of local recurrence.  Abdominal: Soft. Bowel sounds are normal. She exhibits no distension and no mass. There is no hepatomegaly. There is no tenderness. There is no rebound and no guarding.  Musculoskeletal:  Left leg appears slightly larger than right leg.  Lymphadenopathy:    She has no cervical adenopathy.    She has no axillary adenopathy.  Neurological: She is alert and oriented to person, place, and time.  Skin: Skin is warm and dry.    Data Reviewed Blood work, Ca 27-29 is slightly elevated.   Assessment    Personal history of malignant neoplasm of bilateral breast  Plan    Recheck Ca 27-29 in 2 months.  Follow up in 6 months. Call if any issues or concerns arise before.     PCP: Dr Deirdre Pippins 09/29/2014, 12:15 PM

## 2014-09-29 NOTE — Patient Instructions (Addendum)
Recheck Ca 27-29 in 2 months.  Follow up in 6 months. Call if any issues or concerns arise before.

## 2014-10-06 ENCOUNTER — Emergency Department: Payer: Medicare Other

## 2014-10-06 ENCOUNTER — Encounter: Payer: Self-pay | Admitting: Emergency Medicine

## 2014-10-06 ENCOUNTER — Emergency Department
Admission: EM | Admit: 2014-10-06 | Discharge: 2014-10-06 | Disposition: A | Discharge status: Home / Self Care | Payer: Medicare Other | Attending: Emergency Medicine | Admitting: Emergency Medicine

## 2014-10-06 DIAGNOSIS — Z79899 Other long term (current) drug therapy: Secondary | ICD-10-CM | POA: Diagnosis not present

## 2014-10-06 DIAGNOSIS — M25562 Pain in left knee: Secondary | ICD-10-CM | POA: Diagnosis present

## 2014-10-06 DIAGNOSIS — Z87891 Personal history of nicotine dependence: Secondary | ICD-10-CM | POA: Insufficient documentation

## 2014-10-06 DIAGNOSIS — M1712 Unilateral primary osteoarthritis, left knee: Secondary | ICD-10-CM | POA: Diagnosis not present

## 2014-10-06 DIAGNOSIS — I10 Essential (primary) hypertension: Secondary | ICD-10-CM | POA: Diagnosis not present

## 2014-10-06 MED ORDER — MELOXICAM 7.5 MG PO TABS: 7.5000 mg | ORAL_TABLET | Freq: Every day | ORAL | Status: AC

## 2014-10-06 NOTE — ED Provider Notes (Signed)
[facility] Emergency Department Provider Note  ____________________________________________  Time seen: Approximately 11:56 AM  I have reviewed the triage vital signs and the nursing notes.   HISTORY  Chief Complaint Knee Pain    HPI [facility] is a 81 y.o. female patient complaining of left knee pain for approximately 4 days. Patient stated no provocative incident for the pain she just woke up with it. Patient states she is able to ambulate and bear weight. Concern about pain anterior patella and also in the medial aspect of the patella.   Past Medical History  Diagnosis Date  . Hypertension   . Diverticulosis of colon   . Heart murmur 07/02/2003  . Breast cancer 08/2013    Left, 1.8cm , N0,M0    Patient Active Problem List   Diagnosis Date Noted  . Insomnia due to psychological stress 11/02/2013  . Dermatitis 11/02/2013  . Hypovitaminosis D 11/01/2013  . 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 08/28/2013  .  bilatteral, right in 1991, left 2000, bilateral lumpectomy, right AD, bil radiation 08/29/2012  . Hypertension 08/15/2011  . Carotid stenosis 08/15/2011  . Encounter for Medicare annual wellness exam 08/15/2011  . Diverticulosis of colon     Past Surgical History  Procedure Laterality Date  . Cataract extraction, bilateral      Tobe Sos,  remote,   . Tonsillectomy  1998  . Cholecystectomy  1996  . Colonoscopy  2009    Dr. Vira Agar  . Basal cell carcinoma excision  2014    face  . Breast surgery      bilateral 1991, and 2000  . Breast surgery Bilateral 09/06/13    bilateral mastectomy/tumor size is 1.8cm, SN neg. This is T1c,N0, M0.  . Mastectomy, radical Bilateral May 2015    Current Outpatient Rx  Name  Route  Sig  Dispense  Refill  . amLODipine (NORVASC) 5 MG tablet      TAKE 1 TABLET EVERY DAY   90 tablet   1   . Calcium Carbonate-Vitamin D  (CALCIUM + D) 600-200 MG-UNIT TABS   Oral   Take by mouth.         Marland Kitchen lisinopril (PRINIVIL,ZESTRIL) 40 MG tablet   Oral   Take 1 tablet (40 mg total) by mouth daily.   90 tablet   3   . traZODone (DESYREL) 50 MG tablet   Oral   Take 0.5-1 tablets (25-50 mg total) by mouth at bedtime as needed for sleep.   30 tablet   3     Allergies Arimidex  Family History  Problem Relation Age of Onset  . Hypertension Mother   . Cancer Father     lung cancer, smoker  . Hypertension Sister     Social History History  Substance Use Topics  . Smoking status: Former Smoker -- 40 years    Types: Cigarettes    Quit date: 08/15/1983  . Smokeless tobacco: Never Used  . Alcohol Use: 4.2 oz/week    7 Glasses of wine per week    Review of Systems Constitutional: No fever/chills Eyes: No visual changes. ENT: No sore throat. Cardiovascular: Denies chest pain. Respiratory: Denies shortness of breath. Gastrointestinal: No abdominal pain.  No nausea, no vomiting.  No diarrhea.  No constipation. Genitourinary: Negative for dysuria. Musculoskeletal: Negative for back pain. Skin: Negative for rash. Neurological: Negative for headaches, focal weakness or numbness. Endocrine: Hematological/Lymphatic: Allergic/Immunilogical: The medication list  10-point ROS otherwise negative.  ____________________________________________   PHYSICAL EXAM:  VITAL SIGNS: ED Triage Vitals  Enc Vitals Group     BP 10/06/14 1130 145/58 mmHg     Pulse Rate 10/06/14 1130 75     Resp 10/06/14 1130 16     Temp 10/06/14 1130 97.8 F (36.6 C)     Temp Source 10/06/14 1130 Oral     SpO2 10/06/14 1130 95 %     Weight 10/06/14 1131 128 lb (58.06 kg)     Height 10/06/14 1131 5\' 1"  (1.549 m)     Head Cir --      Peak Flow --      Pain Score 10/06/14 1123 8     Pain Loc --      Pain Edu? --      Excl. in [place]? --     Constitutional: Alert and oriented. Well appearing and in no acute distress. Eyes:  Conjunctivae are normal. PERRL. EOMI. Head: Atraumatic. Nose: No congestion/rhinnorhea. Mouth/Throat: Mucous membranes are moist.  Oropharynx non-erythematous. Neck: No stridor. No deformity nontender palpation full and equal range of motion. Hematological/Lymphatic/Immunilogical: No cervical lymphadenopathy. Cardiovascular: Normal rate, regular rhythm. Grossly normal heart sounds.  Good peripheral circulation. Respiratory: Normal respiratory effort.  No retractions. Lungs CTAB. Gastrointestinal: Soft and nontender. No distention. No abdominal bruits. No CVA tenderness. Musculoskeletal: No lower extremity tenderness nor edema.  No joint effusions. Neurologic:  Normal speech and language. No gross focal neurologic deficits are appreciated. Speech is normal. No gait instability. Skin:  Skin is warm, dry and intact. No rash noted. Psychiatric: Mood and affect are normal. Speech and behavior are normal.  ____________________________________________   LABS (all labs ordered are listed, but only abnormal results are displayed)  Labs Reviewed - No data to display ____________________________________________  EKG   ____________________________________________  RADIOLOGY  Joint space narrowed between the femur patella joint ____________________________________________   PROCEDURES  Procedure(s) performed: None  Critical Care performed: No  ____________________________________________   INITIAL IMPRESSION / ASSESSMENT AND PLAN / ED COURSE  Pertinent labs & imaging results that were available during my care of the patient were reviewed by me and considered in my medical decision making (see chart for details).  Left knee pain ____________________________________________   FINAL CLINICAL IMPRESSION(S) / ED DIAGNOSES  Final diagnoses:  Primary osteoarthritis of left knee      Sable Feil, PA-C 10/06/14 [place], MD 10/06/14 [phone]

## 2014-10-06 NOTE — Discharge Instructions (Signed)
Arthritis, Nonspecific °Arthritis is pain, redness, warmth, or puffiness (inflammation) of a joint. The joint may be stiff or hurt when you move it. One or more joints may be affected. There are many types of arthritis. Your doctor may not know what type you have right away. The most common cause of arthritis is wear and tear on the joint (osteoarthritis). °HOME CARE  °· Only take medicine as told by your doctor. °· Rest the joint as much as possible. °· Raise (elevate) your joint if it is puffy. °· Use crutches if the painful joint is in your leg. °· Drink enough fluids to keep your pee (urine) clear or pale yellow. °· Follow your doctor's diet instructions. °· Use cold packs for very bad joint pain for 10 to 15 minutes every hour. Ask your doctor if it is okay for you to use hot packs. °· Exercise as told by your doctor. °· Take a warm shower if you have stiffness in the morning. °· Move your sore joints throughout the day. °GET HELP RIGHT AWAY IF:  °· You have a fever. °· You have very bad joint pain, puffiness, or redness. °· You have many joints that are painful and puffy. °· You are not getting better with treatment. °· You have very bad back pain or leg weakness. °· You cannot control when you poop (bowel movement) or pee (urinate). °· You do not feel better in 24 hours or are getting worse. °· You are having side effects from your medicine. °MAKE SURE YOU:  °· Understand these instructions. °· Will watch your condition. °· Will get help right away if you are not doing well or get worse. °Document Released: 06/29/2009 Document Revised: 10/04/2011 Document Reviewed: 06/29/2009 °[employer]® Patient Information ©2015 [employer]. This information is not intended to replace advice given to you by your health care provider. Make sure you discuss any questions you have with your health care provider. ° °

## 2014-10-06 NOTE — ED Notes (Signed)
Pt informed to return if any life threatening symptoms occur.  

## 2014-10-06 NOTE — ED Notes (Signed)
Pt reports that she woke up Thursday with left knee pain. No known injury. She is able to ambulate and bear weight.

## 2014-10-23 ENCOUNTER — Ambulatory Visit (INDEPENDENT_AMBULATORY_CARE_PROVIDER_SITE_OTHER): Payer: Medicare Other | Admitting: Internal Medicine

## 2014-10-23 ENCOUNTER — Encounter: Payer: Self-pay | Admitting: Internal Medicine

## 2014-10-23 VITALS — BP 146/58 | HR 99 | Temp 97.4°F | Resp 12 | Ht 61.0 in | Wt 130.2 lb

## 2014-10-23 DIAGNOSIS — M1712 Unilateral primary osteoarthritis, left knee: Secondary | ICD-10-CM | POA: Diagnosis not present

## 2014-10-23 MED ORDER — METHYLPREDNISOLONE ACETATE 80 MG/ML IJ SUSP
40.0000 mg | Freq: Once | INTRAMUSCULAR | Status: AC
  Administered 2014-10-23: 40 mg via INTRA_ARTICULAR

## 2014-10-23 MED ORDER — LIDOCAINE HCL 1 % IJ SOLN
4.0000 mL | Freq: Once | INTRAMUSCULAR | Status: AC
  Administered 2014-10-23: 4 mL

## 2014-10-23 NOTE — Progress Notes (Signed)
Subjective:  Patient ID: [facility], female    DOB: 1928-03-29  Age: 81 y.o. MRN: [id number]  CC: The encounter diagnosis was Primary osteoarthritis of left knee.  HPI [facility] presents for persistent left knee pain,  Seen in ER two weeks ago , x rays  Taken.  OA diagnosed .  Using OTC topicals, no longer able to walk the golf course,  Due to pain with weight bearing.  Aggravated by walking,  Current pain level is   6-8/10  .  Did not try the meloxicam for more than one week bc it did not help   Outpatient Prescriptions Prior to Visit  Medication Sig Dispense Refill  . amLODipine (NORVASC) 5 MG tablet TAKE 1 TABLET EVERY DAY 90 tablet 1  . Calcium Carbonate-Vitamin D (CALCIUM + D) 600-200 MG-UNIT TABS Take by mouth.    Marland Kitchen lisinopril (PRINIVIL,ZESTRIL) 40 MG tablet Take 1 tablet (40 mg total) by mouth daily. 90 tablet 3  . meloxicam (MOBIC) 7.5 MG tablet Take 1 tablet (7.5 mg total) by mouth daily. 30 tablet 0  . traZODone (DESYREL) 50 MG tablet Take 0.5-1 tablets (25-50 mg total) by mouth at bedtime as needed for sleep. 30 tablet 3   No facility-administered medications prior to visit.    Review of Systems;  Patient denies headache, fevers, malaise, unintentional weight loss, skin rash, eye pain, sinus congestion and sinus pain, sore throat, dysphagia,  hemoptysis , cough, dyspnea, wheezing, chest pain, palpitations, orthopnea, edema, abdominal pain, nausea, melena, diarrhea, constipation, flank pain, dysuria, hematuria, urinary  Frequency, nocturia, numbness, tingling, seizures,  Focal weakness, Loss of consciousness,  Tremor, insomnia, depression, anxiety, and suicidal ideation.      Objective:  BP 146/58 mmHg  Pulse 99  Temp(Src) 97.4 F (36.3 C) (Oral)  Resp 12  Ht 5\' 1"  (1.549 m)  Wt 130 lb 4 oz (59.081 kg)  BMI 24.62 kg/m2  SpO2 94%  BP Readings from Last 3 Encounters:  10/23/14 146/58  10/06/14 145/58  09/29/14 138/70    Wt Readings from Last 3  Encounters:  10/23/14 130 lb 4 oz (59.081 kg)  10/06/14 128 lb (58.06 kg)  09/29/14 129 lb 6.4 oz (58.695 kg)    General appearance: alert, cooperative and appears stated age Ears: normal TM's and external ear canals both ears Throat: lips, mucosa, and tongue normal; teeth and gums normal Neck: no adenopathy, no carotid bruit, supple, symmetrical, trachea midline and thyroid not enlarged, symmetric, no tenderness/mass/nodules Back: symmetric, no curvature. ROM normal. No CVA tenderness. Lungs: clear to auscultation bilaterally Heart: regular rate and rhythm, S1, S2 normal, no murmur, click, rub or gallop Abdomen: soft, non-tender; bowel sounds normal; no masses,  no organomegaly Pulses: 2+ and symmetric Skin: Skin color, texture, turgor normal. No rashes or lesions Lymph nodes: Cervical, supraclavicular, and axillary nodes normal.  No results found for: HGBA1C  Lab Results  Component Value Date   CREATININE 0.78 05/02/2014   CREATININE 0.7 10/30/2013   CREATININE 0.90 08/28/2013    Lab Results  Component Value Date   WBC 10.9* 08/28/2013   HGB 14.8 08/28/2013   HCT 43.7 08/28/2013   PLT 280 08/28/2013   GLUCOSE 99 05/02/2014   CHOL 163 10/03/2012   TRIG 77.0 10/03/2012   HDL 58.00 10/03/2012   LDLCALC 90 10/03/2012   ALT 19 05/02/2014   AST 21 05/02/2014   NA 140 05/02/2014   K 4.3 05/02/2014   CL 102 05/02/2014   CREATININE 0.78  05/02/2014   BUN 15 05/02/2014   CO2 30 05/02/2014   TSH 1.93 10/30/2013    Dg Knee Complete 4 Views Left  10/06/2014   CLINICAL DATA:  Left knee pain, redness, and swelling medially. Difficulty with weight-bearing. No known recent injury.  EXAM: LEFT KNEE - COMPLETE 4+ VIEW  COMPARISON:  None.  FINDINGS: No acute fracture, dislocation, or knee joint effusion is identified. There is mild medial and patellofemoral compartment joint space narrowing. No lytic or blastic osseous lesion is seen. Vascular calcification is noted.  IMPRESSION: No  acute osseous abnormality.  Mild osteoarthrosis.   Electronically Signed   By: Logan Bores   On: 10/06/2014 13:04    Assessment & Plan:   Problem List Items Addressed This Visit      Unprioritized   Left knee DJD - Primary     With recent films showing mild DJD of two compartments.  Informed consent for joint injection obtained.  Area was cleaned with betadine.  Left subpatellar space was  was injected with Depo Medrol  and Xylocaine .  Procedure was tolerated well and knee was feeling less painful by the time she left the office.  She was advised to rest the knee  for 48 hours , apply ice packs every 6 hours for 15 minutes, advised to call if she develops signs of infection        Relevant Medications   methylPREDNISolone acetate (DEPO-MEDROL) injection 40 mg (Completed)   lidocaine (XYLOCAINE) 1 % (with pres) injection 4 mL (Completed)      I am having [facility] maintain her Calcium Carbonate-Vitamin D, traZODone, lisinopril, amLODipine, and meloxicam. We administered methylPREDNISolone acetate and lidocaine.  Meds ordered this encounter  Medications  . methylPREDNISolone acetate (DEPO-MEDROL) injection 40 mg    Sig:   . lidocaine (XYLOCAINE) 1 % (with pres) injection 4 mL    Sig:     There are no discontinued medications.  Follow-up: No Follow-up on file.   Crecencio Mc, MD

## 2014-10-23 NOTE — Progress Notes (Signed)
Pre-visit discussion using our clinic review tool. No additional management support is needed unless otherwise documented below in the visit note.  

## 2014-10-23 NOTE — Patient Instructions (Signed)
You can use up to 2000 mg of tylenol daily for pain ,  And 2 aleve daily   Joint Injection Care After Refer to this sheet in the next few days. These instructions provide you with information on caring for yourself after you have had a joint injection. Your caregiver also may give you more specific instructions. Your treatment has been planned according to current medical practices, but problems sometimes occur. Call your caregiver if you have any problems or questions after your procedure. After any type of joint injection, it is not uncommon to experience:  Soreness, swelling, or bruising around the injection site.  Mild numbness, tingling, or weakness around the injection site caused by the numbing medicine used before or with the injection. It also is possible to experience the following effects associated with the specific agent after injection:  Iodine-based contrast agents:  Allergic reaction (itching, hives, widespread redness, and swelling beyond the injection site).  Corticosteroids (These effects are rare.):  Allergic reaction.  Increased blood sugar levels (If you have diabetes and you notice that your blood sugar levels have increased, notify your caregiver).  Increased blood pressure levels.  Mood swings.  Hyaluronic acid in the use of viscosupplementation.  Temporary heat or redness.  Temporary rash and itching.  Increased fluid accumulation in the injected joint. These effects all should resolve within a day after your procedure.  HOME CARE INSTRUCTIONS  Limit yourself to light activity the day of your procedure. Avoid lifting heavy objects, bending, stooping, or twisting.  Take prescription or over-the-counter pain medication as directed by your caregiver.  You may apply ice to your injection site to reduce pain and swelling the day of your procedure. Ice may be applied 03-04 times:  Put ice in a plastic bag.  Place a towel between your skin and the  bag.  Leave the ice on for no longer than 15-20 minutes each time. SEEK IMMEDIATE MEDICAL CARE IF:   Pain and swelling get worse rather than better or extend beyond the injection site.  Numbness does not go away.  Blood or fluid continues to leak from the injection site.  You have chest pain.  You have swelling of your face or tongue.  You have trouble breathing or you become dizzy.  You develop a fever, chills, or severe tenderness at the injection site that last longer than 1 day. MAKE SURE YOU:  Understand these instructions.  Watch your condition.  Get help right away if you are not doing well or if you get worse. Document Released: 12/16/2010 Document Revised: 06/27/2011 Document Reviewed: 12/16/2010 [facility] Patient Information 2015 [place], [place]. This information is not intended to replace advice given to you by your health care provider. Make sure you discuss any questions you have with your health care provider.

## 2014-10-25 NOTE — Assessment & Plan Note (Addendum)
With recent films showing mild DJD of two compartments.  Informed consent for joint injection obtained.  Area was cleaned with betadine.  Left subpatellar space was  was injected with Depo Medrol  and Xylocaine .  Procedure was tolerated well and knee was feeling less painful by the time she left the office.  She was advised to rest the knee  for 48 hours , apply ice packs every 6 hours for 15 minutes, advised to call if she develops signs of infection

## 2014-10-31 ENCOUNTER — Other Ambulatory Visit: Payer: Self-pay | Admitting: Internal Medicine

## 2014-11-12 ENCOUNTER — Other Ambulatory Visit: Payer: Self-pay

## 2014-11-12 DIAGNOSIS — C50412 Malignant neoplasm of upper-outer quadrant of left female breast: Secondary | ICD-10-CM

## 2014-11-22 LAB — CANCER ANTIGEN 27.29: CA 27.29: 26.6 U/mL (ref 0.0–38.6)

## 2014-12-15 ENCOUNTER — Ambulatory Visit (INDEPENDENT_AMBULATORY_CARE_PROVIDER_SITE_OTHER): Payer: Medicare Other | Admitting: Family Medicine

## 2014-12-15 ENCOUNTER — Encounter: Payer: Self-pay | Admitting: Family Medicine

## 2014-12-15 VITALS — BP 124/66 | HR 77 | Temp 98.6°F | Ht 61.0 in | Wt 126.0 lb

## 2014-12-15 DIAGNOSIS — M109 Gout, unspecified: Secondary | ICD-10-CM

## 2014-12-15 DIAGNOSIS — M10072 Idiopathic gout, left ankle and foot: Secondary | ICD-10-CM | POA: Diagnosis not present

## 2014-12-15 LAB — BASIC METABOLIC PANEL
BUN: 16 mg/dL (ref 6–23)
CO2: 27 mEq/L (ref 19–32)
CREATININE: 0.75 mg/dL (ref 0.40–1.20)
Calcium: 10.3 mg/dL (ref 8.4–10.5)
Chloride: 99 mEq/L (ref 96–112)
GFR: 77.96 mL/min (ref >60.00)
Glucose, Bld: 102 mg/dL — ABNORMAL HIGH (ref 70–99)
Potassium: 4.7 mEq/L (ref 3.5–5.1)
Sodium: 135 mEq/L (ref 135–145)

## 2014-12-15 MED ORDER — COLCHICINE 0.6 MG PO TABS: ORAL_TABLET | ORAL | Status: DC

## 2014-12-15 NOTE — Patient Instructions (Addendum)
Nice to meet you. You likely have gout. We will treat this with colchicine. If you develop worseing pain, redness, fevers, chills, nausea, vomiting, or beging to feel poorly please seek medical attention.  If you develop abdominal pain, vomiting, diarrhea, fatigue, headache, or throat pain please seek medical attention.

## 2014-12-15 NOTE — Progress Notes (Signed)
Pre visit review using our clinic review tool, if applicable. No additional management support is needed unless otherwise documented below in the visit note. 

## 2014-12-15 NOTE — Assessment & Plan Note (Addendum)
History and exam findings consistent with acute gout flare in first left MTP joint. Patient with history of gout and prior elevation of uric acid. Not on prophylactic medication for gout. She appears systemically well and vitals are stable all after 7 days of this issue making infectious cause unlikely. Will treat with colchicine. Creatine clearance and dosing confirmed with [facility] pharmacist. Advised patient to monitor for signs of infection including fever, nausea, vomiting, spreading redness, and worsening pain. Also advised patient on common adverse effects of colchicine and to monitor for these and inform us if they occur. Will have her follow-up later this week to ensure improvement. Given return precautions.

## 2014-12-15 NOTE — Progress Notes (Signed)
Patient ID: KAIYA BOATMAN, female   DOB: Aug 29, 1927, 81 y.o.   MRN: [id number]  Tommi Rumps, MD Phone: [phone]  LUANN ASPINWALL is a 81 y.o. female who presents today for same day appointment.  Gout flare: patient reports for the past week she has had a gout flare in her left 1st MTP joint. Notes the discomfort started abruptly. The area hurts with any sort of pressure. Is a dull ache at base line. Has become erythematous overlying the MTP joint. No warmth to the area. No fevers. No chills. No nausea. Notes she feels well other than the discomfort of the area. Has a history of gout and has been ~2 years since her last gout flare. Has been taking ibuprofen for this with no benefit. Last uric acid was 7.2 2 years ago. Notes the last time she had this it was following eating bacon, which she ate last week prior to this. Reports this is exactly like her previous gout flares.   PMH: nonsmoker. History of gout not on prophylactic medication.    ROS see HPI  Objective  Physical Exam Filed Vitals:   12/15/14 0755  BP: 124/66  Pulse: 77  Temp: 98.6 F (37 C)    Physical Exam  Constitutional: She is well-developed, well-nourished, and in no distress.  HENT:  Head: Normocephalic and atraumatic.  Cardiovascular: Normal rate, regular rhythm and normal heart sounds.  Exam reveals no gallop and no friction rub.   No murmur heard. Pulmonary/Chest: Effort normal and breath sounds normal. No respiratory distress. She has no wheezes. She has no rales.  Musculoskeletal:  Left first MTP joint with overlying erythema, minimal swelling noted, there is tenderness to palpation, no warmth to the area, no other foot swelling, toes WWP, full ROM left first MTP and first toe, no streaking, induration, or fluctuance noted over the first MTP joint, no other erythema in left foot Right foot with no swelling, erythema, warmth, or tenderness, WWP, full ROM right first MTP and toe  Neurological: She is  alert.  5/5 strength right and left first toe, plantar flexion, and dorsiflexion, sensation to light touch intact in bilateral feet  Skin: Skin is warm and dry. She is not diaphoretic.     Assessment/Plan: Please see individual problem list.  Gout attack History and exam findings consistent with acute gout flare in first left MTP joint. Patient with history of gout and prior elevation of uric acid. Not on prophylactic medication for gout. She appears systemically well and vitals are stable all after 7 days of this issue making infectious cause unlikely. Will treat with colchicine. Creatine clearance and dosing confirmed with [facility] pharmacist. Advised patient to monitor for signs of infection including fever, nausea, vomiting, spreading redness, and worsening pain. Also advised patient on common adverse effects of colchicine and to monitor for these and inform us if they occur. Will have her follow-up later this week to ensure improvement. Given return precautions.    Orders Placed This Encounter  Procedures  . Basic Metabolic Panel (BMET)    Meds ordered this encounter  Medications  . colchicine 0.6 MG tablet    Sig: Take 1.2 mg (2 tablets) now, followed in 1 hour with a single dose of 0.6 mg (one tablet), then 0.6 mg (one tablet) daily starting tomorrow.    Dispense:  30 tablet    Refill:  0    Tommi Rumps

## 2014-12-18 ENCOUNTER — Ambulatory Visit (INDEPENDENT_AMBULATORY_CARE_PROVIDER_SITE_OTHER): Payer: Medicare Other | Admitting: Family Medicine

## 2014-12-18 ENCOUNTER — Encounter: Payer: Self-pay | Admitting: Family Medicine

## 2014-12-18 VITALS — BP 114/60 | HR 83 | Temp 98.1°F | Ht 61.0 in | Wt 126.2 lb

## 2014-12-18 DIAGNOSIS — M109 Gout, unspecified: Secondary | ICD-10-CM

## 2014-12-18 DIAGNOSIS — M10072 Idiopathic gout, left ankle and foot: Secondary | ICD-10-CM

## 2014-12-18 NOTE — Progress Notes (Signed)
Patient ID: [facility], female   DOB: 1927-09-23, 81 y.o.   MRN: [id number]  Tommi Rumps, MD Phone: [phone]  Miranda Watkins is a 81 y.o. female who presents today for f/u.   Gout flare: patient reports she is improved. Her pain is gone in the last day. The erythema is much improved and minimal at this time. She has full ROM first MTP. No fevers or chills. Has been taking colchicine as prescribed. Denies any adverse reactions to colchicine that were discussed at her last visit. Reports she feels well.  PMH: nonsmoker.   ROS see HPI  Objective  Physical Exam Filed Vitals:   12/18/14 1552  BP: 114/60  Pulse: 83  Temp: 98.1 F (36.7 C)    BP Readings from Last 3 Encounters:  12/18/14 114/60  12/15/14 124/66  10/23/14 146/58   Wt Readings from Last 3 Encounters:  12/18/14 126 lb 4 oz (57.267 kg)  12/15/14 126 lb (57.153 kg)  10/23/14 130 lb 4 oz (59.081 kg)    Physical Exam  Constitutional: She is well-developed, well-nourished, and in no distress.  Cardiovascular: Normal rate, regular rhythm and normal heart sounds.  Exam reveals no gallop and no friction rub.   No murmur heard. Pulmonary/Chest: Effort normal and breath sounds normal. No respiratory distress. She has no wheezes. She has no rales.  Musculoskeletal:  Left first MTP joint with minimal overlying erythema much improved from previously, non-tender, no warmth, full ROM, no swelling, WWP toes, sensation to light touch intact, 5/5 strength left first toe flexion and extension  Skin: Skin is warm and dry. She is not diaphoretic.     Assessment/Plan: Please see individual problem list.  Gout attack Improved. Will continue colchicine for 2 more days for complete resolution of mild erythema. If erythema continues despite 2 more days of treatment or if she redevelops pain she is to call our office. Advised on adverse reactions and to stop the medication if these occur and call out office. Given return  precautions.     Tommi Rumps

## 2014-12-18 NOTE — Patient Instructions (Signed)
Nice to see you. I'm glad you are improved.  Please take 2 more days of colchicine as long as your symptoms continue to improve and resolve. If you have recurrence of discomfort please let us know.  If you develop fever, chills, sweats, redness, or joint pain please seek medical attention.

## 2014-12-18 NOTE — Progress Notes (Signed)
Pre visit review using our clinic review tool, if applicable. No additional management support is needed unless otherwise documented below in the visit note. 

## 2014-12-18 NOTE — Assessment & Plan Note (Signed)
Improved. Will continue colchicine for 2 more days for complete resolution of mild erythema. If erythema continues despite 2 more days of treatment or if she redevelops pain she is to call our office. Advised on adverse reactions and to stop the medication if these occur and call out office. Given return precautions.

## 2015-04-06 ENCOUNTER — Ambulatory Visit: Payer: Self-pay | Admitting: General Surgery

## 2015-05-07 ENCOUNTER — Other Ambulatory Visit: Payer: Self-pay | Admitting: Internal Medicine

## 2015-07-15 ENCOUNTER — Encounter: Payer: Self-pay | Admitting: *Deleted

## 2015-08-10 ENCOUNTER — Other Ambulatory Visit: Payer: Self-pay | Admitting: Internal Medicine

## 2015-09-15 ENCOUNTER — Telehealth: Payer: Self-pay | Admitting: Internal Medicine

## 2015-09-15 DIAGNOSIS — M1712 Unilateral primary osteoarthritis, left knee: Secondary | ICD-10-CM

## 2015-09-15 NOTE — Telephone Encounter (Signed)
Pt needs a referral to the Rheumatology for her left knee ay [facility] did the Xray.   Call pt @ [phone]. Thank you!

## 2015-09-16 NOTE — Telephone Encounter (Signed)
Last OV was last July with you, had Xray at [facility], needs referral for left knee. Please advise?

## 2015-09-17 NOTE — Telephone Encounter (Signed)
Your referral is in process as requested. Our referral coordinator will call you when the appointment has been made with Dr Jefm Bryant

## 2015-09-17 NOTE — Telephone Encounter (Signed)
Spoke with the patient.  She verbalized understanding.

## 2015-10-05 ENCOUNTER — Ambulatory Visit: Payer: Medicare Other | Admitting: General Surgery

## 2015-10-12 ENCOUNTER — Telehealth: Payer: Self-pay

## 2015-10-12 NOTE — Telephone Encounter (Signed)
Noted. Will follow as appropriate.  

## 2015-10-12 NOTE — Telephone Encounter (Signed)
Patient is on the list for [employer] 2017 and may be a good candidate for an AWV in 2017. Please let me know if/when appt is scheduled.   

## 2015-10-14 ENCOUNTER — Encounter: Payer: Self-pay | Admitting: *Deleted

## 2015-10-26 ENCOUNTER — Ambulatory Visit (INDEPENDENT_AMBULATORY_CARE_PROVIDER_SITE_OTHER): Payer: Medicare Other | Admitting: General Surgery

## 2015-10-26 ENCOUNTER — Encounter: Payer: Self-pay | Admitting: General Surgery

## 2015-10-26 VITALS — BP 120/60 | HR 80 | Resp 15 | Ht 61.0 in | Wt 126.0 lb

## 2015-10-26 DIAGNOSIS — C50412 Malignant neoplasm of upper-outer quadrant of left female breast: Secondary | ICD-10-CM

## 2015-10-26 NOTE — Progress Notes (Signed)
Patient ID: Miranda Watkins, female   DOB: [birth year], 82 y.o.   MRN: [id number]  Chief Complaint  Patient presents with  . Follow-up    breast cancer    HPI [name] is a 82 y.o. female who presents for  breast cancer follow up. . She has a history of bilateral breast cancer and bilateral mastectomies. She reports that she is doing very well.  She is doing well and has no chest wall symptoms. I have reviewed the history of present illness with the patient.   HPI  Past Medical History  Diagnosis Date  . Hypertension   . Diverticulosis of colon   . Heart murmur 07/02/2003  . Breast cancer ([place]) 08/2013    Left, 1.8cm , N0,M0 Bilateral    Past Surgical History  Procedure Laterality Date  . Cataract extraction, bilateral      Tobe Sos,  remote,   . Tonsillectomy  1998  . Cholecystectomy  1996  . Colonoscopy  2009    Dr. Vira Watkins  . Basal cell carcinoma excision  2014    face  . Breast surgery      bilateral 1991, and 2000  . Breast surgery Bilateral 09/06/13    bilateral mastectomy/tumor size is 1.8cm, SN neg. This is T1c,N0, M0.  . Mastectomy, radical Bilateral May 2015    Family History  Problem Relation Age of Onset  . Hypertension Mother   . Cancer Father     lung cancer, smoker  . Hypertension Sister     Social History Social History  Substance Use Topics  . Smoking status: Former Smoker -- 40 years    Types: Cigarettes    Quit date: 08/15/1983  . Smokeless tobacco: Never Used  . Alcohol Use: 4.2 oz/week    7 Glasses of wine per week    Allergies  Allergen Reactions  . Arimidex [Anastrozole] Rash    Current Outpatient Prescriptions  Medication Sig Dispense Refill  . amLODipine (NORVASC) 5 MG tablet TAKE 1 TABLET EVERY DAY 90 tablet 0  . Calcium Carbonate-Vitamin D (CALCIUM + D) 600-200 MG-UNIT TABS Take by mouth.    Marland Kitchen lisinopril (PRINIVIL,ZESTRIL) 40 MG tablet TAKE 1 TABLET EVERY DAY 90 tablet 0   No current facility-administered medications  for this visit.    Review of Systems Review of Systems  Constitutional: Negative.   Respiratory: Negative.   Cardiovascular: Negative.     Blood pressure 120/60, pulse 80, resp. rate 15, height 5\' 1"  (1.549 m), weight 126 lb (57.153 kg).  Physical Exam Physical Exam  Constitutional: She is oriented to person, place, and time. She appears well-developed and well-nourished.  Eyes: Conjunctivae are normal. No scleral icterus.  Neck: Neck supple.  Cardiovascular: Normal rate, regular rhythm, normal heart sounds and normal pulses.   Pulmonary/Chest: Effort normal and breath sounds normal.  Mastectomy sites are well healed with no sign of local recurrence.    Abdominal: Soft. Normal appearance and bowel sounds are normal. There is no tenderness.  Lymphadenopathy:    She has no cervical adenopathy.    She has no axillary adenopathy.  Neurological: She is alert and oriented to person, place, and time.  Skin: Skin is warm and dry.  Psychiatric: She has a normal mood and affect.    Data Reviewed Last yr she had mild elevation of CA 27-29, but 2 mo was normal.  Assessment    History of breast cancer, stable exam.     Plan    Labs today,  CA 27-29. Follow up in one year with exam.      PCP: Deborra Medina This has been scribed by Lesly Rubenstein LPN    Christene Lye 10/27/2015, 5:24 PM

## 2015-10-27 ENCOUNTER — Telehealth: Payer: Self-pay | Admitting: *Deleted

## 2015-10-27 ENCOUNTER — Encounter: Payer: Self-pay | Admitting: General Surgery

## 2015-10-27 DIAGNOSIS — C50412 Malignant neoplasm of upper-outer quadrant of left female breast: Secondary | ICD-10-CM

## 2015-10-27 LAB — CANCER ANTIGEN 27.29: CA 27.29: 43.1 U/mL — ABNORMAL HIGH (ref 0.0–38.6)

## 2015-10-27 NOTE — Telephone Encounter (Signed)
Notified patient as instructed, patient pleased. Discussed follow-up appointments, patient agrees Mailed lab form, pt aware to repeat in 2 months

## 2015-10-27 NOTE — Telephone Encounter (Signed)
-----   Message from Christene Lye, MD sent at 10/27/2015  7:44 AM EDT ----- Value is slightly above normal. Recheck in 2 mos.

## 2015-11-09 ENCOUNTER — Ambulatory Visit (INDEPENDENT_AMBULATORY_CARE_PROVIDER_SITE_OTHER): Payer: Medicare Other | Admitting: Internal Medicine

## 2015-11-09 ENCOUNTER — Encounter: Payer: Self-pay | Admitting: Internal Medicine

## 2015-11-09 VITALS — BP 140/66 | HR 100 | Temp 97.6°F | Resp 16 | Wt 127.0 lb

## 2015-11-09 DIAGNOSIS — R5382 Chronic fatigue, unspecified: Secondary | ICD-10-CM

## 2015-11-09 DIAGNOSIS — Z0000 Encounter for general adult medical examination without abnormal findings: Secondary | ICD-10-CM | POA: Diagnosis not present

## 2015-11-09 DIAGNOSIS — Z853 Personal history of malignant neoplasm of breast: Secondary | ICD-10-CM

## 2015-11-09 DIAGNOSIS — R011 Cardiac murmur, unspecified: Secondary | ICD-10-CM

## 2015-11-09 DIAGNOSIS — I10 Essential (primary) hypertension: Secondary | ICD-10-CM

## 2015-11-09 DIAGNOSIS — E559 Vitamin D deficiency, unspecified: Secondary | ICD-10-CM | POA: Diagnosis not present

## 2015-11-09 DIAGNOSIS — M1712 Unilateral primary osteoarthritis, left knee: Secondary | ICD-10-CM

## 2015-11-09 LAB — COMPREHENSIVE METABOLIC PANEL
ALBUMIN: 4.6 g/dL (ref 3.5–5.2)
ALT: 18 U/L (ref 0–35)
AST: 20 U/L (ref 0–37)
Alkaline Phosphatase: 61 U/L (ref 39–117)
BILIRUBIN TOTAL: 0.4 mg/dL (ref 0.2–1.2)
BUN: 14 mg/dL (ref 6–23)
CALCIUM: 10.8 mg/dL — AB (ref 8.4–10.5)
CHLORIDE: 100 meq/L (ref 96–112)
CO2: 28 mEq/L (ref 19–32)
Creatinine, Ser: 0.7 mg/dL (ref 0.40–1.20)
GFR: 84.24 mL/min (ref >60.00)
Glucose, Bld: 100 mg/dL — ABNORMAL HIGH (ref 70–99)
Potassium: 4.4 mEq/L (ref 3.5–5.1)
SODIUM: 138 meq/L (ref 135–145)
TOTAL PROTEIN: 7.2 g/dL (ref 6.0–8.3)

## 2015-11-09 LAB — CBC WITH DIFFERENTIAL/PLATELET
BASOS PCT: 0.4 % (ref 0.0–3.0)
Basophils Absolute: 0 10*3/uL (ref 0.0–0.1)
EOS PCT: 0.7 % (ref 0.0–5.0)
Eosinophils Absolute: 0.1 10*3/uL (ref 0.0–0.7)
HEMATOCRIT: 44.3 % (ref 36.0–46.0)
HEMOGLOBIN: 14.7 g/dL (ref 12.0–15.0)
LYMPHS PCT: 21.2 % (ref 12.0–46.0)
Lymphs Abs: 2.5 10*3/uL (ref 0.7–4.0)
MCHC: 33.2 g/dL (ref 30.0–36.0)
MCV: 87.9 fl (ref 78.0–100.0)
Monocytes Absolute: 0.9 10*3/uL (ref 0.1–1.0)
Monocytes Relative: 7.2 % (ref 3.0–12.0)
NEUTROS PCT: 70.5 % (ref 43.0–77.0)
Neutro Abs: 8.4 10*3/uL — ABNORMAL HIGH (ref 1.4–7.7)
PLATELETS: 271 10*3/uL (ref 150.0–400.0)
RBC: 5.05 Mil/uL (ref 3.87–5.11)
RDW: 15 % (ref 11.5–15.5)
WBC: 11.9 10*3/uL — AB (ref 4.0–10.5)

## 2015-11-09 LAB — TSH: TSH: 2.12 u[IU]/mL (ref 0.35–4.50)

## 2015-11-09 LAB — LIPID PANEL
CHOLESTEROL: 165 mg/dL (ref 0–200)
HDL: 56.7 mg/dL (ref >39.00)
LDL Cholesterol: 83 mg/dL (ref 0–99)
NonHDL: 108.64
TRIGLYCERIDES: 126 mg/dL (ref 0.0–149.0)
Total CHOL/HDL Ratio: 3
VLDL: 25.2 mg/dL (ref 0.0–40.0)

## 2015-11-09 LAB — VITAMIN D 25 HYDROXY (VIT D DEFICIENCY, FRACTURES): VITD: 36.48 ng/mL (ref 30.00–100.00)

## 2015-11-09 NOTE — Progress Notes (Signed)
Patient ID: Miranda Watkins, female    DOB: 1927/11/07  Age: 82 y.o. MRN: [id number]  The patient is here for annual Medicare wellness examination and management of other chronic and acute problems.   The risk factors are reflected in the social history.  The roster of all physicians providing medical care to patient - is listed in the Snapshot section of the chart.  Activities of daily living:  The patient is 100% independent in all ADLs: dressing, toileting, feeding as well as independent mobility  Home safety : The patient has smoke detectors in the home. They wear seatbelts.  There are no firearms at home. There is no violence in the home.   There is no risks for hepatitis, STDs or HIV. There is no   history of blood transfusion. They have no travel history to infectious disease endemic areas of the world.  The patient has seen their dentist in the last six month. They have seen their eye doctor in the last year. They admit to slight hearing difficulty with regard to whispered voices and some television programs.  They have deferred audiologic testing in the last year.  They do not  have excessive sun exposure. Discussed the need for sun protection: hats, long sleeves and use of sunscreen if there is significant sun exposure.   Diet: the importance of a healthy diet is discussed. They do have a healthy diet.  The benefits of regular aerobic exercise were discussed. She walks 4 times per week ,  20 minutes.   Depression screen: there are no signs or vegative symptoms of depression- irritability, change in appetite, anhedonia, sadness/tearfullness.  Cognitive assessment: the patient manages all their financial and personal affairs and is actively engaged. They could relate day,date,year and events; recalled 2/3 objects at 3 minutes; performed clock-face test normally.  The following portions of the patient's history were reviewed and updated as appropriate: allergies, current medications,  past family history, past medical history,  past surgical history, past social history  and problem list.  Visual acuity was not assessed per patient preference since she has regular follow up with her ophthalmologist. Hearing and body mass index were assessed and reviewed.   During the course of the visit the patient was educated and counseled about appropriate screening and preventive services including : fall prevention , diabetes screening, nutrition counseling, colorectal cancer screening, and recommended immunizations.    CC: The primary encounter diagnosis was Vitamin D deficiency. Diagnoses of Chronic fatigue, Essential hypertension, benign, Primary osteoarthritis of left knee, Hypovitaminosis D,  bilateral BRCA  right in 1991, left 2000, bilateral lumpectomy, right AD, bil radiation, Encounter for Medicare annual wellness exam, Essential hypertension, and Heart murmur were also pertinent to this visit.  follow up on hypertension .  She was last seen one year ago. Her last dose of amlodipine and lisinopril were yesterday.  She has no new complaints but has been suffering from persistent Left knee pain due to mild OA (by plain films in 2016) and is now S/p injection by [facility] rheumatologist  With kenalog on June 7 .  She reports significant improvement in pain but not complete resolution and anticipates having another one in October.  The pain has not prevented her from playing golf regularly.  She denies any recent falls, and lives alone with a daughter several blocks away.  Last meal 8:30   History [place] has a past medical history of Breast cancer (Miranda Watkins) (08/2013); Diverticulosis of colon; Heart murmur (07/02/2003); and  Hypertension.   She has a past surgical history that includes Cataract extraction, bilateral; Tonsillectomy (1998); Cholecystectomy (1996); Colonoscopy (2009); Excision basal cell carcinoma (2014); Breast surgery; Breast surgery (Bilateral, 09/06/13); and Mastectomy,  radical (Bilateral, May 2015).   Her family history includes Cancer in her father; Hypertension in her mother and sister.She reports that she quit smoking about 32 years ago. Her smoking use included Cigarettes. She quit after 40.00 years of use. She has never used smokeless tobacco. She reports that she drinks about 4.2 oz of alcohol per week . She reports that she does not use drugs.  Outpatient Medications Prior to Visit  Medication Sig Dispense Refill  . Calcium Carbonate-Vitamin D (CALCIUM + D) 600-200 MG-UNIT TABS Take by mouth.    Marland Kitchen amLODipine (NORVASC) 5 MG tablet TAKE 1 TABLET EVERY DAY (Patient not taking: Reported on 11/09/2015) 90 tablet 0  . lisinopril (PRINIVIL,ZESTRIL) 40 MG tablet TAKE 1 TABLET EVERY DAY (Patient not taking: Reported on 11/09/2015) 90 tablet 0   No facility-administered medications prior to visit.     Review of Systems   Patient denies headache, fevers, malaise, unintentional weight loss, skin rash, eye pain, sinus congestion and sinus pain, sore throat, dysphagia,  hemoptysis , cough, dyspnea, wheezing, chest pain, palpitations, orthopnea, edema, abdominal pain, nausea, melena, diarrhea, constipation, flank pain, dysuria, hematuria, urinary  Frequency, nocturia, numbness, tingling, seizures,  Focal weakness, Loss of consciousness,  Tremor, insomnia, depression, anxiety, and suicidal ideation.      Objective:  BP 140/66 (BP Location: Right Arm, Patient Position: Sitting, Cuff Size: Normal)   Pulse 100   Temp 97.6 F (36.4 C) (Oral)   Resp 16   Wt 127 lb (57.6 kg)   BMI 24.00 kg/m   Physical Exam  General appearance: alert, cooperative and appears stated age Ears: normal TM's and external ear canals both ears Throat: lips, mucosa, and tongue normal; teeth and gums normal Neck: no adenopathy, no carotid bruit, supple, symmetrical, trachea midline and thyroid not enlarged, symmetric, no tenderness/mass/nodules Back: symmetric, no curvature. ROM normal. No  CVA tenderness. Lungs: clear to auscultation bilaterally Heart: regular rate and rhythm, S1, S2 normal, no murmur, click, rub or gallop Abdomen: soft, non-tender; bowel sounds normal; no masses,  no organomegaly Pulses: 2+ and symmetric Skin: Skin color, texture, turgor normal. No rashes or lesions Lymph nodes: Cervical, supraclavicular, and axillary nodes normal.    Assessment & Plan:   Problem List Items Addressed This Visit    Hypertension    Well controlled on current regimen. Renal function due, no changes today.      Encounter for Medicare annual wellness exam    Annual Medicare wellness  exam was done as well as a comprehensive physical exam and management of acute and chronic conditions .  During the course of the visit the patient was educated and counseled about appropriate screening and preventive services including : fall prevention , diabetes screening, nutrition counseling, colorectal cancer screening, and recommended immunizations.  Printed recommendations for health maintenance screenings was given.        bilateral BRCA  right in 1991, left 2000, bilateral lumpectomy, right AD, bil radiation   Hypovitaminosis D    checking level again. Not interested in DEXA scan given age       Left knee DJD    Mild by 2016 x rays.  2nd injection given in May at [place] murmur    Unchanged,  Asymptomatic.  She continues to  defer workup        Other Visit Diagnoses    Vitamin D deficiency    -  Primary   Relevant Orders   VITAMIN D 25 Hydroxy (Vit-D Deficiency, Fractures)   Chronic fatigue       Relevant Orders   CBC with Differential/Platelet   TSH   Essential hypertension, benign       Relevant Orders   Lipid panel   Comprehensive metabolic panel      I am having Ms. Zigmund Daniel maintain her Calcium Carbonate-Vitamin D, lisinopril, and amLODipine.  No orders of the defined types were placed in this encounter.   There are no discontinued  medications.  Follow-up: No Follow-up on file.   Crecencio Mc, MD

## 2015-11-09 NOTE — Assessment & Plan Note (Signed)
Mild by 2016 x rays.  2nd injection given in May at [place]

## 2015-11-09 NOTE — Assessment & Plan Note (Signed)
Well controlled on current regimen. Renal function due.,   no changes today. 

## 2015-11-09 NOTE — Assessment & Plan Note (Signed)
Unchanged,  Asymptomatic.  She continues to defer workup

## 2015-11-09 NOTE — Assessment & Plan Note (Signed)
checking level again. Not interested in DEXA scan given age

## 2015-11-09 NOTE — Assessment & Plan Note (Signed)

## 2015-11-09 NOTE — Patient Instructions (Signed)
I will refill your medications tonight after I see your labs from today  It will not hurt you to be without them for a day

## 2015-11-10 ENCOUNTER — Other Ambulatory Visit: Payer: Self-pay | Admitting: Internal Medicine

## 2015-11-11 ENCOUNTER — Telehealth: Payer: Self-pay | Admitting: Internal Medicine

## 2015-11-11 NOTE — Telephone Encounter (Signed)
Pt lvm on my ext stating that she needs a return call regarding questions about a rx.

## 2015-11-11 NOTE — Telephone Encounter (Signed)
Spoke with patient, this was already taken care of thanks

## 2015-12-26 IMAGING — MG MM DIGITAL SCREENING BILAT W/ CAD
3 series · 6 of 6 positions shown · non-contrast
Comparison: Previous exam(s)

CLINICAL DATA: Screening. History of bilateral breast cancer. Right
breast cancer diagnosed in 6556. Left breast cancer diagnosed in
3999. Status post bilateral radiation treatment.

EXAM:
DIGITAL SCREENING BILATERAL MAMMOGRAM WITH CAD

[L CC · left · 4 of 4 slices shown (1 of 2)]
[im 1/4]
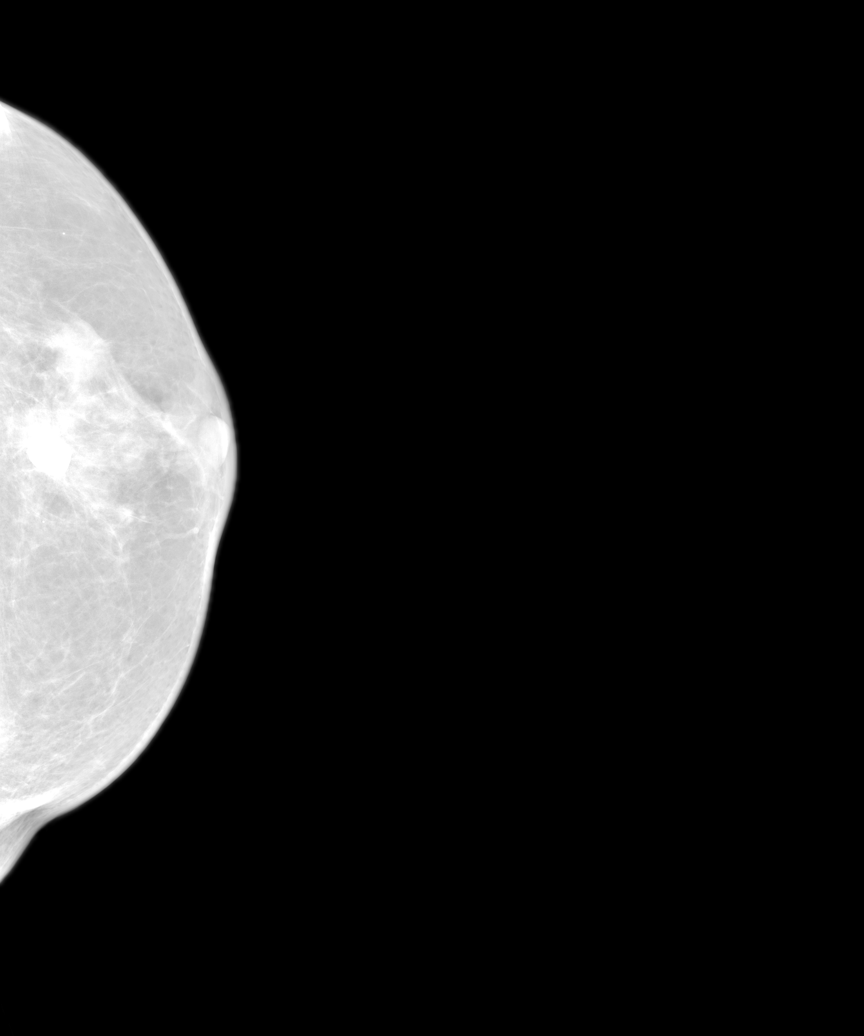
[im 2/4]
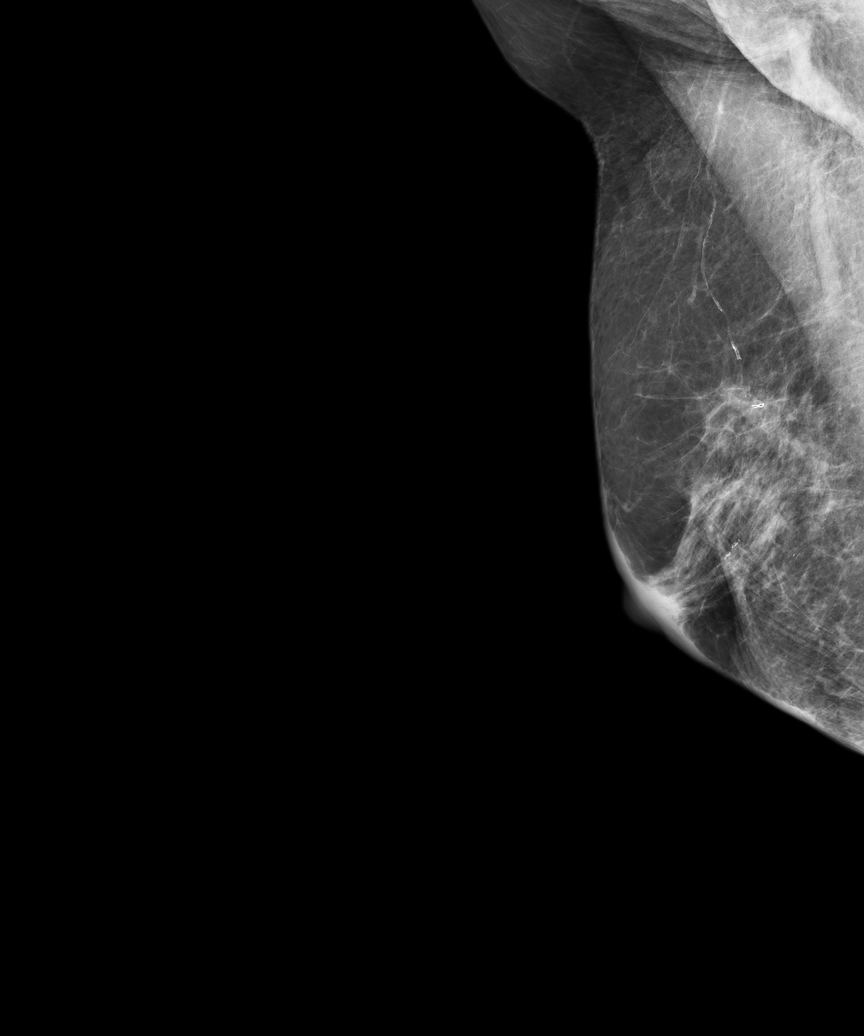
[im 3/4]
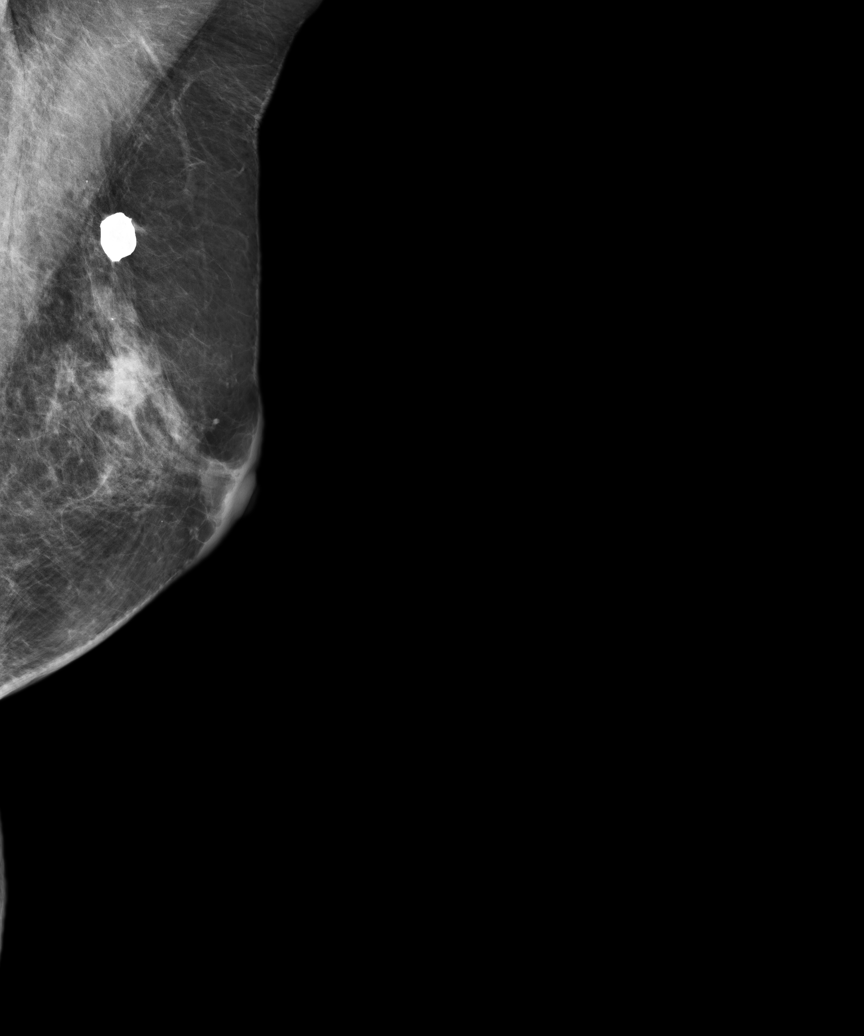
[im 4/4]
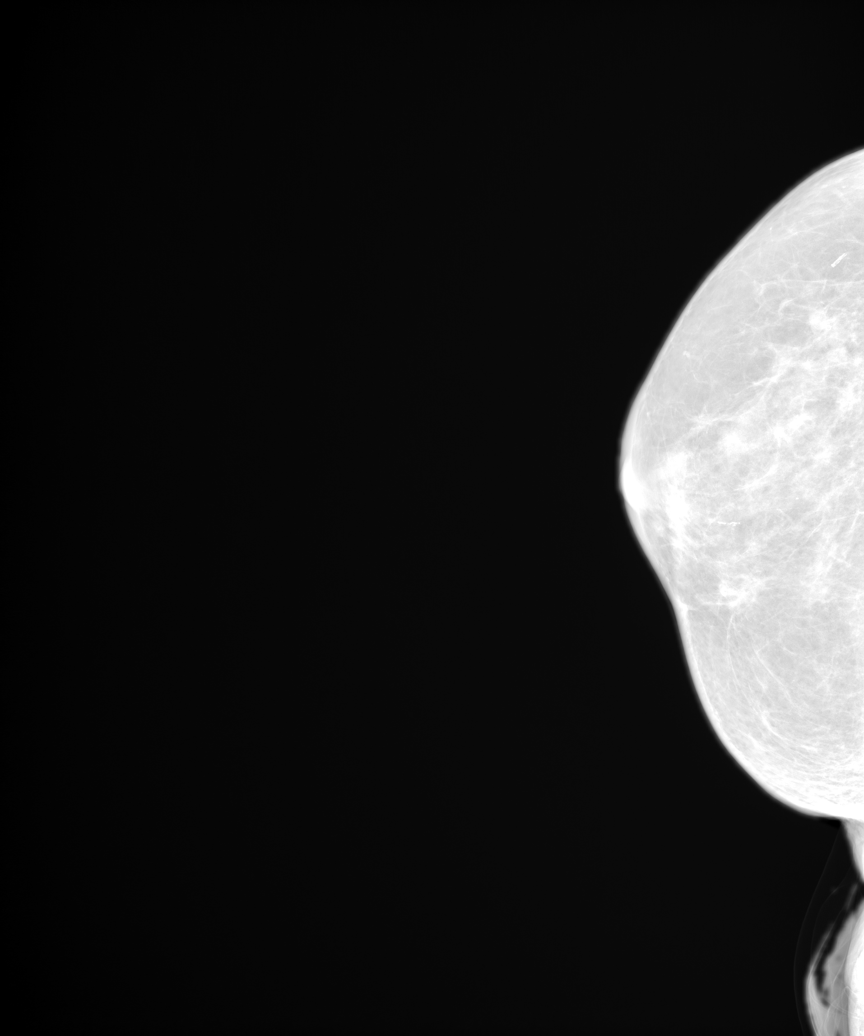

[L CC (2 of 2)]
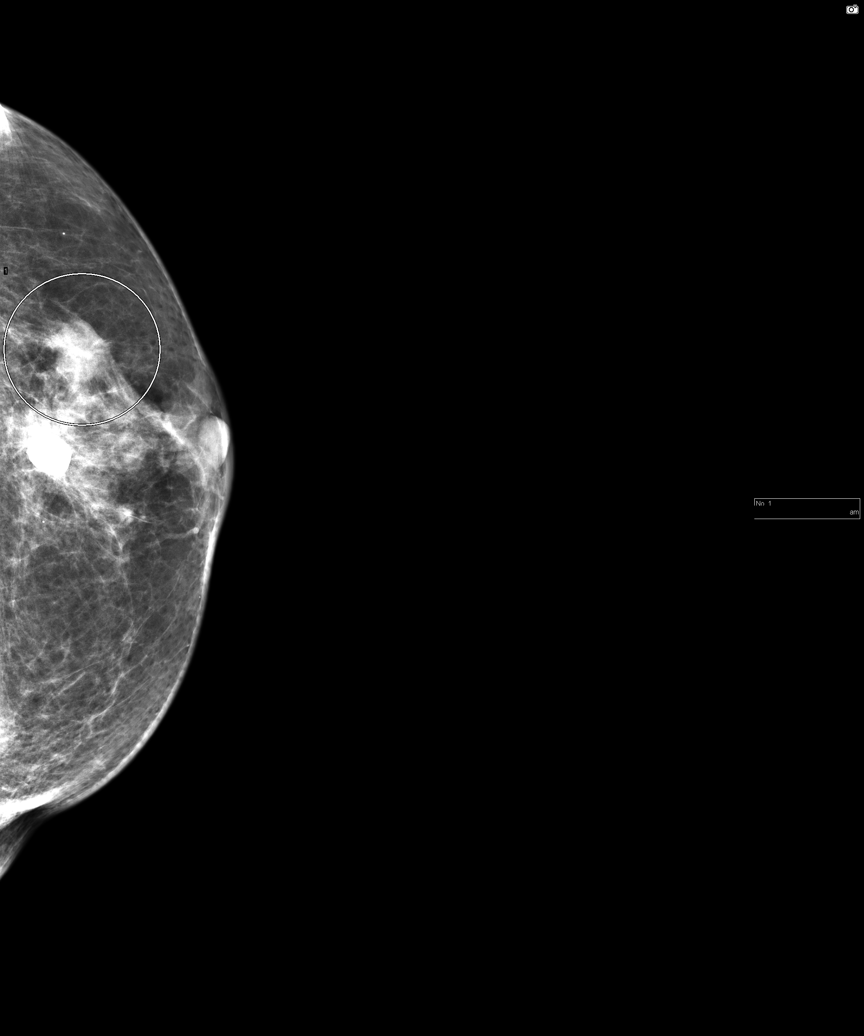

[L MLO]
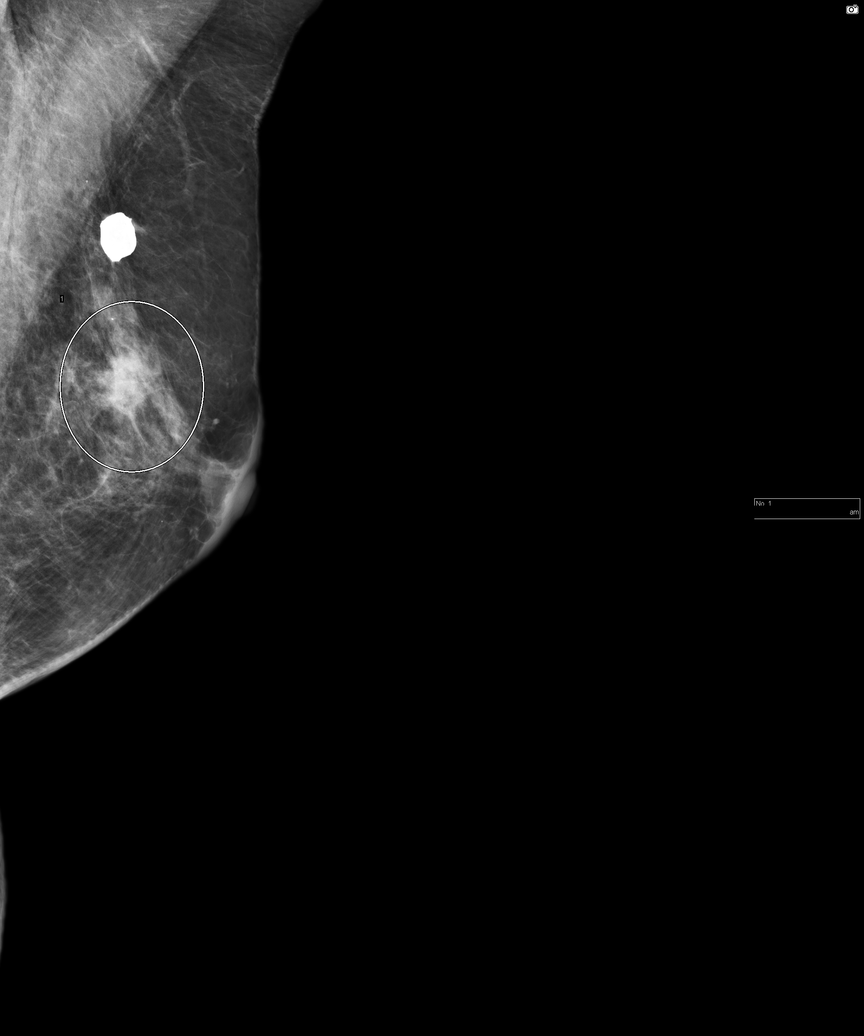

[6 of 6 positions shown; findings below may reference images not displayed]

ACR Breast Density Category b: There are scattered areas of
fibroglandular density.
FINDINGS: In the left breast, a possible mass warrants further evaluation with
spot compression views and possibly ultrasound. In the right breast,
no findings suspicious for malignancy. Postoperative changes are
seen bilaterally.

Images were processed with CAD.
IMPRESSION: Further evaluation is suggested for possible mass in the left
breast.

RECOMMENDATION:
Diagnostic mammogram and possibly ultrasound of the left breast.
(Code:19-A-EE2)

The patient will be contacted regarding the findings, and additional
imaging will be scheduled.

BI-RADS CATEGORY  0: Incomplete. Need additional imaging evaluation
and/or prior mammograms for comparison.

## 2016-01-12 LAB — CANCER ANTIGEN 27.29: CA 27.29: 35 U/mL (ref 0.0–38.6)

## 2016-01-13 ENCOUNTER — Telehealth: Payer: Self-pay | Admitting: *Deleted

## 2016-01-13 NOTE — Telephone Encounter (Signed)
Notified patient as instructed, patient pleased. Discussed follow-up appointments, patient agrees  

## 2016-01-13 NOTE — Telephone Encounter (Signed)
-----   Message from Christene Lye, MD sent at 01/12/2016  5:48 PM EDT ----- Value back to normal. Will recheck at next visit Rosann Auerbach, please let pt know

## 2016-08-01 ENCOUNTER — Encounter (INDEPENDENT_AMBULATORY_CARE_PROVIDER_SITE_OTHER): Payer: Medicare Other

## 2016-08-01 ENCOUNTER — Ambulatory Visit (INDEPENDENT_AMBULATORY_CARE_PROVIDER_SITE_OTHER): Payer: Medicare Other | Admitting: Vascular Surgery

## 2016-08-19 ENCOUNTER — Encounter: Payer: Self-pay | Admitting: *Deleted

## 2016-10-31 ENCOUNTER — Other Ambulatory Visit: Payer: Self-pay | Admitting: Internal Medicine

## 2016-10-31 ENCOUNTER — Ambulatory Visit: Payer: Medicare Other | Admitting: General Surgery

## 2016-11-02 ENCOUNTER — Encounter: Payer: Self-pay | Admitting: General Surgery

## 2016-11-02 ENCOUNTER — Ambulatory Visit (INDEPENDENT_AMBULATORY_CARE_PROVIDER_SITE_OTHER): Payer: Medicare Other | Admitting: General Surgery

## 2016-11-02 VITALS — BP 130/62 | HR 80 | Resp 12 | Ht 61.0 in | Wt 125.0 lb

## 2016-11-02 DIAGNOSIS — Z170 Estrogen receptor positive status [ER+]: Secondary | ICD-10-CM | POA: Diagnosis not present

## 2016-11-02 DIAGNOSIS — C50412 Malignant neoplasm of upper-outer quadrant of left female breast: Secondary | ICD-10-CM | POA: Diagnosis not present

## 2016-11-02 NOTE — Progress Notes (Addendum)
Patient ID: Miranda Watkins, female   DOB: 04/07/1928, 83 y.o.   MRN: [id number]  Chief Complaint  Patient presents with  . Follow-up    HPI Miranda Watkins is a 83 y.o. female.  who presents for her breast cancer follow up and a breast evaluation.Bilateral mastectomy 2015 for bilateral breast cancer. She states she is doing well, no complaints.  HPI  Past Medical History:  Diagnosis Date  . Breast cancer ([place]) 08/2013   Left, 1.8cm , N0,M0/ ER/PR pos Her2 neg/ INVASIVE MAMMARY (DUCTAL) CARCINOMA/Bilateral  . Breast cancer ([place]) 1990   right breast  . Diverticulosis of colon   . Heart murmur 07/02/2003  . Hypertension     Past Surgical History:  Procedure Laterality Date  . BASAL CELL CARCINOMA EXCISION  2014   face  . BREAST SURGERY     bilateral 1991, and 2000  . BREAST SURGERY Bilateral 09/06/13   bilateral mastectomy/tumor size is 1.8cm, SN neg. This is T1c,N0, M0.  Marland Kitchen CATARACT EXTRACTION, BILATERAL     Tobe Sos,  remote,   . CHOLECYSTECTOMY  1996  . COLONOSCOPY  2009   Dr. Vira Agar  . MASTECTOMY, RADICAL Bilateral May 2015  . TONSILLECTOMY  1998    Family History  Problem Relation Age of Onset  . Hypertension Mother   . Cancer Father        lung cancer, smoker  . Hypertension Sister     Social History Social History  Substance Use Topics  . Smoking status: Former Smoker    Years: 40.00    Types: Cigarettes    Quit date: 08/15/1983  . Smokeless tobacco: Never Used  . Alcohol use 4.2 oz/week    7 Glasses of wine per week    Allergies  Allergen Reactions  . Arimidex [Anastrozole] Rash    Current Outpatient Prescriptions  Medication Sig Dispense Refill  . amLODipine (NORVASC) 5 MG tablet TAKE 1 TABLET BY MOUTH DAILY 90 tablet 1  . Calcium Carbonate-Vitamin D (CALCIUM + D) 600-200 MG-UNIT TABS Take by mouth.    Marland Kitchen lisinopril (PRINIVIL,ZESTRIL) 40 MG tablet TAKE 1 TABLET BY MOUTH DAILY 90 tablet 1   No current facility-administered medications for this  visit.     Review of Systems Review of Systems  Constitutional: Negative.   Respiratory: Negative.   Cardiovascular: Negative.     Blood pressure 130/62, pulse 80, resp. rate 12, height '5\' 1"'$  (1.549 m), weight 125 lb (56.7 kg).  Physical Exam Physical Exam  Constitutional: She is oriented to person, place, and time. She appears well-developed and well-nourished.  HENT:  Mouth/Throat: Oropharynx is clear and moist.  Eyes: Conjunctivae are normal. No scleral icterus.  Neck: Neck supple.  Cardiovascular: Normal rate, regular rhythm and normal heart sounds.   Pulmonary/Chest: Effort normal and breath sounds normal.  Bilateral mastectomy sites clean  Lymphadenopathy:    She has no cervical adenopathy.    She has no axillary adenopathy.  Neurological: She is alert and oriented to person, place, and time.  Skin: Skin is warm and dry.  Psychiatric: Her behavior is normal.    Data Reviewed Prior notes  Assessment    History of bilateral breast cancer- most recent on left with T1c,N0,M0, er/pr pos, her 2 neg Pt did not tolerate antihormonal therapy-she declined to to attempt this again. Exam is stable    Plan    CA 27.29 today Patient will be asked to return to the office in one year with Dr Bary Castilla.  HPI, Physical Exam, Assessment and Plan have been scribed under the direction and in the presence of Mckinley Jewel, MD Karie Fetch, RN I have completed the exam and reviewed the above documentation for accuracy and completeness.  I agree with the above.  Haematologist has been used and any errors in dictation or transcription are unintentional.  Seeplaputhur G. Jamal Collin, M.D., F.A.C.S.   Junie Panning G 11/02/2016, 10:03 AM

## 2016-11-02 NOTE — Patient Instructions (Addendum)
The patient is aware to call back for any questions or concerns. CA 27.29 Patient will be asked to return to the office in one year with Dr Bary Castilla.

## 2016-11-03 LAB — PLEASE NOTE

## 2016-11-03 LAB — CANCER ANTIGEN 27.29: CA 27.29: 27.6 U/mL (ref 0.0–38.6)

## 2016-11-04 ENCOUNTER — Telehealth: Payer: Self-pay

## 2016-11-04 NOTE — Telephone Encounter (Signed)
Notified patient as instructed, patient pleased. Discussed follow-up appointments, patient agrees  

## 2016-11-04 NOTE — Telephone Encounter (Signed)
-----   Message from Christene Lye, MD sent at 11/04/2016  8:28 AM EDT ----- Normal lab. Follow up as scheduled

## 2016-11-30 ENCOUNTER — Encounter: Payer: Self-pay | Admitting: Internal Medicine

## 2016-11-30 ENCOUNTER — Ambulatory Visit (INDEPENDENT_AMBULATORY_CARE_PROVIDER_SITE_OTHER): Payer: Medicare Other | Admitting: Internal Medicine

## 2016-11-30 VITALS — BP 140/80 | HR 74 | Temp 97.8°F | Resp 15 | Wt 127.0 lb

## 2016-11-30 DIAGNOSIS — M109 Gout, unspecified: Secondary | ICD-10-CM | POA: Diagnosis not present

## 2016-11-30 DIAGNOSIS — Z853 Personal history of malignant neoplasm of breast: Secondary | ICD-10-CM | POA: Diagnosis not present

## 2016-11-30 DIAGNOSIS — I10 Essential (primary) hypertension: Secondary | ICD-10-CM

## 2016-11-30 DIAGNOSIS — E559 Vitamin D deficiency, unspecified: Secondary | ICD-10-CM

## 2016-11-30 DIAGNOSIS — R5383 Other fatigue: Secondary | ICD-10-CM | POA: Diagnosis not present

## 2016-11-30 DIAGNOSIS — M1712 Unilateral primary osteoarthritis, left knee: Secondary | ICD-10-CM | POA: Diagnosis not present

## 2016-11-30 DIAGNOSIS — F5102 Adjustment insomnia: Secondary | ICD-10-CM | POA: Diagnosis not present

## 2016-11-30 DIAGNOSIS — I6522 Occlusion and stenosis of left carotid artery: Secondary | ICD-10-CM

## 2016-11-30 DIAGNOSIS — E7800 Pure hypercholesterolemia, unspecified: Secondary | ICD-10-CM | POA: Diagnosis not present

## 2016-11-30 LAB — COMPREHENSIVE METABOLIC PANEL
ALT: 14 U/L (ref 0–35)
AST: 16 U/L (ref 0–37)
Albumin: 4.4 g/dL (ref 3.5–5.2)
Alkaline Phosphatase: 62 U/L (ref 39–117)
BILIRUBIN TOTAL: 0.4 mg/dL (ref 0.2–1.2)
BUN: 9 mg/dL (ref 6–23)
CHLORIDE: 100 meq/L (ref 96–112)
CO2: 32 meq/L (ref 19–32)
Calcium: 10.3 mg/dL (ref 8.4–10.5)
Creatinine, Ser: 0.67 mg/dL (ref 0.40–1.20)
GFR: 88.39 mL/min (ref >60.00)
GLUCOSE: 100 mg/dL — AB (ref 70–99)
Potassium: 4.6 mEq/L (ref 3.5–5.1)
Sodium: 140 mEq/L (ref 135–145)
Total Protein: 6.4 g/dL (ref 6.0–8.3)

## 2016-11-30 LAB — CBC WITH DIFFERENTIAL/PLATELET
BASOS ABS: 0 10*3/uL (ref 0.0–0.1)
BASOS PCT: 0.3 % (ref 0.0–3.0)
EOS PCT: 3.6 % (ref 0.0–5.0)
Eosinophils Absolute: 0.4 10*3/uL (ref 0.0–0.7)
HEMATOCRIT: 42.8 % (ref 36.0–46.0)
Hemoglobin: 14 g/dL (ref 12.0–15.0)
LYMPHS ABS: 1.9 10*3/uL (ref 0.7–4.0)
Lymphocytes Relative: 19.4 % (ref 12.0–46.0)
MCHC: 32.8 g/dL (ref 30.0–36.0)
MCV: 92.4 fl (ref 78.0–100.0)
MONOS PCT: 10.4 % (ref 3.0–12.0)
Monocytes Absolute: 1 10*3/uL (ref 0.1–1.0)
NEUTROS ABS: 6.6 10*3/uL (ref 1.4–7.7)
NEUTROS PCT: 66.3 % (ref 43.0–77.0)
PLATELETS: 271 10*3/uL (ref 150.0–400.0)
RBC: 4.63 Mil/uL (ref 3.87–5.11)
RDW: 14.4 % (ref 11.5–15.5)
WBC: 10 10*3/uL (ref 4.0–10.5)

## 2016-11-30 LAB — VITAMIN D 25 HYDROXY (VIT D DEFICIENCY, FRACTURES): VITD: 25.34 ng/mL — AB (ref 30.00–100.00)

## 2016-11-30 LAB — LIPID PANEL
CHOL/HDL RATIO: 2
Cholesterol: 134 mg/dL (ref 0–200)
HDL: 58.5 mg/dL (ref >39.00)
LDL Cholesterol: 41 mg/dL (ref 0–99)
NonHDL: 75.49
Triglycerides: 173 mg/dL — ABNORMAL HIGH (ref 0.0–149.0)
VLDL: 34.6 mg/dL (ref 0.0–40.0)

## 2016-11-30 MED ORDER — PREDNISONE 10 MG PO TABS: ORAL_TABLET | ORAL | 0 refills | Status: DC

## 2016-11-30 NOTE — Assessment & Plan Note (Addendum)
Involving the left MCP (great toe),  Has not had GFR checked inover a year so will use prednisone taper and check GFR today .  If recurs,  Will use colchicine

## 2016-11-30 NOTE — Patient Instructions (Addendum)
I am prescribing  Prednisone in a  tapering dose for 6 days to treat  The inflammation that is causing your  gout flare   6 tablets all at once on Day 1,  Then taper by 1 tablet daily until gone  You can add up to 2000 mg tylenol daily in divided doses  For pain control      The ShingRx vaccine is now available in local pharmacies and is much more protective thant Zostavax,  It is therefore ADVISED for all interested adults over 50 to prevent shingles

## 2016-11-30 NOTE — Progress Notes (Signed)
Subjective:  Patient ID: Miranda Watkins, female    DOB: [birth year]  Age: 82 y.o. MRN: [id number]  CC: The primary encounter diagnosis was Vitamin D deficiency. Diagnoses of Essential hypertension, Pure hypercholesterolemia, Fatigue, unspecified type, Acute gout involving toe of left foot, unspecified cause,  bilateral BRCA  right in 1991, left 2000, bilateral lumpectomy, right AD, bil radiation, Primary osteoarthritis of left knee, Insomnia due to psychological stress, and Stenosis of left carotid artery were also pertinent to this visit.  HPI Miranda Watkins presents for follow up on hypertension.  Last seen July 2017   Having a gout flare presently in the left great toe. Last episode was 2016 treated with colchicine    History of bilateral Breast Cancer diagnosed 2015 s/p bilateral mastectomy annual follow up with [facility]  OA: sees rheumatology at [facility] for left knee pain   Weight stable.   Patient  has been having some trouble sleeping.  Wakes up 2 to 3 times per night . Bedtime hygiene reviewed,  Patient has NOT  been using an electronic book to read before bed.  Does not drink caffeinated beverages after 3 PM.  Only voids  bladder once per night.  No snoring partner. Does not drink alcohol to excess.  Not exercising excessively in the evening. Patient does have a history of anxiety but does not lie awake worrying about issues that cannot be resolved. Does not take stimulants. .  Lab Results  Component Value Date   CREATININE 0.67 11/30/2016   Lab Results  Component Value Date   NA 140 11/30/2016   K 4.6 11/30/2016   CL 100 11/30/2016   CO2 32 11/30/2016       Outpatient Medications Prior to Visit  Medication Sig Dispense Refill  . amLODipine (NORVASC) 5 MG tablet TAKE 1 TABLET BY MOUTH DAILY 90 tablet 1  . Calcium Carbonate-Vitamin D (CALCIUM + D) 600-200 MG-UNIT TABS Take by mouth.    Marland Kitchen lisinopril (PRINIVIL,ZESTRIL) 40 MG tablet TAKE 1 TABLET BY MOUTH DAILY 90  tablet 1   No facility-administered medications prior to visit.     Review of Systems;  Patient denies headache, fevers, malaise, unintentional weight loss, skin rash, eye pain, sinus congestion and sinus pain, sore throat, dysphagia,  hemoptysis , cough, dyspnea, wheezing, chest pain, palpitations, orthopnea, edema, abdominal pain, nausea, melena, diarrhea, constipation, flank pain, dysuria, hematuria, urinary  Frequency, nocturia, numbness, tingling, seizures,  Focal weakness, Loss of consciousness,  Tremor, insomnia, depression, anxiety, and suicidal ideation.      Objective:  BP 140/80   Pulse 74   Temp 97.8 F (36.6 C) (Oral)   Resp 15   Wt 127 lb (57.6 kg)   LMP  (Exact Date)   SpO2 95%   BMI 24.00 kg/m   BP Readings from Last 3 Encounters:  11/30/16 140/80  11/02/16 130/62  11/09/15 140/66    Wt Readings from Last 3 Encounters:  11/30/16 127 lb (57.6 kg)  11/02/16 125 lb (56.7 kg)  11/09/15 127 lb (57.6 kg)    General appearance: alert, cooperative and appears stated age Ears: normal TM's and external ear canals both ears Throat: lips, mucosa, and tongue normal; teeth and gums normal Neck: no adenopathy, no carotid bruit, supple, symmetrical, trachea midline and thyroid not enlarged, symmetric, no tenderness/mass/nodules Back: symmetric, no curvature. ROM normal. No CVA tenderness. Lungs: clear to auscultation bilaterally Heart: regular rate and rhythm, S1, S2 normal, no murmur, click, rub or gallop Abdomen: soft, non-tender; bowel  sounds normal; no masses,  no organomegaly Pulses: 2+ and symmetric Skin: Skin color, texture, turgor normal. No rashes or lesions Lymph nodes: Cervical, supraclavicular, and axillary nodes normal.  No results found for: HGBA1C  Lab Results  Component Value Date   CREATININE 0.67 11/30/2016   CREATININE 0.70 11/09/2015   CREATININE 0.75 12/15/2014    Lab Results  Component Value Date   WBC 10.0 11/30/2016   HGB 14.0  11/30/2016   HCT 42.8 11/30/2016   PLT 271.0 11/30/2016   GLUCOSE 100 (H) 11/30/2016   CHOL 134 11/30/2016   TRIG 173.0 (H) 11/30/2016   HDL 58.50 11/30/2016   LDLCALC 41 11/30/2016   ALT 14 11/30/2016   AST 16 11/30/2016   NA 140 11/30/2016   K 4.6 11/30/2016   CL 100 11/30/2016   CREATININE 0.67 11/30/2016   BUN 9 11/30/2016   CO2 32 11/30/2016   TSH 2.12 11/09/2015    Dg Knee Complete 4 Views Left  Result Date: 10/06/2014 CLINICAL DATA:  Left knee pain, redness, and swelling medially. Difficulty with weight-bearing. No known recent injury. EXAM: LEFT KNEE - COMPLETE 4+ VIEW COMPARISON:  None. FINDINGS: No acute fracture, dislocation, or knee joint effusion is identified. There is mild medial and patellofemoral compartment joint space narrowing. No lytic or blastic osseous lesion is seen. Vascular calcification is noted. IMPRESSION: No acute osseous abnormality.  Mild osteoarthrosis. Electronically Signed   By: Logan Bores   On: 10/06/2014 13:04    Assessment & Plan:   Problem List Items Addressed This Visit     bilateral BRCA  right in 1991, left 2000, bilateral lumpectomy, right AD, bil radiation    Diagnosed in 2015, s/p bilateral mastectomy by Dr. Jamal Collin, now on Arimidex      Carotid stenosis    With right carotid bruit on eam.  The carotid ultrasound she had done in June showed bilateral disease but not significant to warrant surgery . 1-49% (Schnier)Needs follow up, last ultrasound was done in  2014.  Referral made        Gout attack    Involving the left MCP (great toe),  Has not had GFR checked inover a year so will use prednisone taper and check GFR today .  If recurs,  Will use colchicine       Hypertension   Relevant Orders   Comprehensive metabolic panel (Completed)   Insomnia due to psychological stress    Discussed natural remedies for insomnia including herbal tea and melatonin.  Reviewd principles of good sleep hygiene      Left knee DJD    Mild,  managed with rest,  And tylenol      Relevant Medications   predniSONE (DELTASONE) 10 MG tablet    Other Visit Diagnoses    Vitamin D deficiency    -  Primary   Relevant Orders   VITAMIN D 25 Hydroxy (Vit-D Deficiency, Fractures) (Completed)   Pure hypercholesterolemia       Relevant Orders   Lipid panel (Completed)   Fatigue, unspecified type       Relevant Orders   CBC with Differential/Platelet (Completed)     A total of 25 minutes of face to face time was spent with patient more than half of which was spent in counselling about the above mentioned conditions  and coordination of care  I am having Ms. Sarff start on predniSONE. I am also having her maintain her Calcium Carbonate-Vitamin D, lisinopril, and amLODipine.  Meds ordered  this encounter  Medications  . predniSONE (DELTASONE) 10 MG tablet    Sig: 6 tablets on Day 1 , then reduce by 1 tablet daily until gone    Dispense:  21 tablet    Refill:  0    There are no discontinued medications.  Follow-up: No Follow-up on file.   Crecencio Mc, MD

## 2016-12-03 NOTE — Assessment & Plan Note (Signed)
With right carotid bruit on eam.  The carotid ultrasound she had done in June showed bilateral disease but not significant to warrant surgery . 1-49% (Schnier)Needs follow up, last ultrasound was done in  2014.  Referral made

## 2016-12-03 NOTE — Assessment & Plan Note (Signed)
Diagnosed in 2015, s/p bilateral mastectomy by Dr. Jamal Collin, now on Arimidex

## 2016-12-03 NOTE — Assessment & Plan Note (Signed)
Mild, managed with rest,  And tylenol

## 2016-12-03 NOTE — Assessment & Plan Note (Signed)
Discussed natural remedies for insomnia including herbal tea and melatonin.  Reviewd principles of good sleep hygiene 

## 2016-12-05 ENCOUNTER — Telehealth: Payer: Self-pay | Admitting: *Deleted

## 2016-12-05 NOTE — Telephone Encounter (Signed)
Pt requested lab results Pt contact [phone]

## 2016-12-05 NOTE — Telephone Encounter (Signed)
Spoke with patient and reviewed labs, see results note for details, thanks

## 2017-01-19 ENCOUNTER — Telehealth: Payer: Self-pay | Admitting: Internal Medicine

## 2017-01-19 NOTE — Telephone Encounter (Signed)
Left pt message asking to call Ebony Hail back directly at [phone] to schedule AWV. Thanks!  *NOTE* Last AWV 11/09/15; can schedule anytime

## 2017-01-20 NOTE — Telephone Encounter (Signed)
Pt declined to schedule AWV this year

## 2017-05-11 ENCOUNTER — Telehealth: Payer: Self-pay | Admitting: Internal Medicine

## 2017-05-11 NOTE — Telephone Encounter (Signed)
Copied from [place] [id number]. Topic: Quick Communication - Rx Refill/Question >> May 11, 2017  5:39 PM Oliver Pila B wrote: Medication: amLODipine (NORVASC) 5 MG tablet [[id number]] , lisinopril (PRINIVIL,ZESTRIL) 40 MG tablet [[id number]]     Has the patient contacted their pharmacy? No.   (Agent: If no, request that the patient contact the pharmacy for the refill.)   Preferred Pharmacy (with phone number or street name): [facility]    Agent: Please be advised that RX refills may take up to 3 business days. We ask that you follow-up with your pharmacy.

## 2017-05-12 NOTE — Telephone Encounter (Signed)
Please advise 

## 2017-05-16 MED ORDER — AMLODIPINE BESYLATE 5 MG PO TABS: 5.0000 mg | ORAL_TABLET | Freq: Every day | ORAL | 1 refills | Status: DC

## 2017-05-16 MED ORDER — LISINOPRIL 40 MG PO TABS: 40.0000 mg | ORAL_TABLET | Freq: Every day | ORAL | 1 refills | Status: DC

## 2017-05-16 NOTE — Telephone Encounter (Signed)
Medication have been refilled [facility].

## 2017-08-16 ENCOUNTER — Other Ambulatory Visit: Payer: Self-pay | Admitting: Internal Medicine

## 2017-10-06 ENCOUNTER — Telehealth: Payer: Self-pay | Admitting: *Deleted

## 2017-10-06 NOTE — Telephone Encounter (Signed)
Copied from [place] [id number]. Topic: General - Other >> Oct 06, 2017  5:19 PM Marin Olp L wrote: Reason for CRM: Patient calling to inquire about having AWV and or CPE. She doesn't think she's had one before. Please call her back to discuss.

## 2017-10-09 NOTE — Telephone Encounter (Signed)
Message has been given to [place] to schedule AWV.

## 2017-10-11 ENCOUNTER — Ambulatory Visit (INDEPENDENT_AMBULATORY_CARE_PROVIDER_SITE_OTHER): Payer: Medicare Other

## 2017-10-11 VITALS — BP 106/64 | HR 78 | Temp 98.2°F | Resp 15 | Ht 62.0 in | Wt 124.8 lb

## 2017-10-11 DIAGNOSIS — Z0000 Encounter for general adult medical examination without abnormal findings: Secondary | ICD-10-CM

## 2017-10-11 NOTE — Patient Instructions (Addendum)
  Miranda Watkins , Thank you for taking time to come for your Medicare Wellness Visit. I appreciate your ongoing commitment to your health goals. Please review the following plan we discussed and let me know if I can assist you in the future.   Follow up as needed.    Bring a copy of your [place] and/or Living Will to be scanned into chart.  Have a great day!  These are the goals we discussed: Goals    . Maintain Healthy Lifestyle     Stay active  Stay hydrated Healthy diet             This is a list of the screening recommended for you and due dates:  Health Maintenance  Topic Date Due  . DEXA scan (bone density measurement)  10/12/2018*  . Flu Shot  11/16/2017  . Tetanus Vaccine  11/08/2023  . Pneumonia vaccines  Completed  *Topic was postponed. The date shown is not the original due date.

## 2017-10-11 NOTE — Progress Notes (Addendum)
Subjective:   Miranda Watkins is a 84 y.o. female who presents for Medicare Annual (Subsequent) preventive examination.  Review of Systems:  No ROS.  Medicare Wellness Visit. Additional risk factors are reflected in the social history. Cardiac Risk Factors include: advanced age (>58mn, >>85women);hypertension     Objective:     Vitals: BP 106/64 (BP Location: Left Arm, Patient Position: Sitting, Cuff Size: Normal)   Pulse 78   Temp 98.2 F (36.8 C) (Oral)   Resp 15   Ht _0  (1.575 m)   Wt 124 lb 12.8 oz (56.6 kg)   SpO2 94%   BMI 22.83 kg/m   Body mass index is 22.83 kg/m.  Advanced Directives 10/11/2017 10/06/2014  Does Patient Have a Medical Advance Directive? Yes No  Type of Advance Directive Living will -  Does patient want to make changes to medical advance directive? No - Patient declined -  Would patient like information on creating a medical advance directive? - No - patient declined information    Tobacco Social History   Tobacco Use  Smoking Status Former Smoker  . Years: 40.00  . Types: Cigarettes  . Last attempt to quit: 08/15/1983  . Years since quitting: 34.1  Smokeless Tobacco Never Used     Counseling given: Not Answered   Clinical Intake:  Pre-visit preparation completed: Yes  Pain : No/denies pain     Diabetes: No  How often do you need to have someone help you when you read instructions, pamphlets, or other written materials from your doctor or pharmacy?: 1 - Never  Interpreter Needed?: No     Past Medical History:  Diagnosis Date  . [place]   Left, 1.8cm , N0,M0/ ER/PR pos Her2 neg/ INVASIVE MAMMARY (DUCTAL) CARCINOMA/Bilateral  . [place]   [place] breast  . Diverticulosis of colon   . Heart murmur 07/02/2003  . Hypertension    Past Surgical History:  Procedure Laterality Date  . BASAL CELL CARCINOMA EXCISION  2014   face  . BREAST SURGERY     bilateral 1991, and 2000  . BREAST  SURGERY Bilateral 09/06/13   bilateral mastectomy/tumor size is 1.8cm, SN neg. This is T1c,N0, M0.  .Marland KitchenCATARACT EXTRACTION, BILATERAL     BTobe Sos  remote,   . CHOLECYSTECTOMY  1996  . COLONOSCOPY  2009   Dr. EVira Agar . MASTECTOMY, RADICAL Bilateral May 2015  . TONSILLECTOMY  1998   Family History  Problem Relation Age of Onset  . Hypertension Mother   . Cancer Father        lung cancer, smoker  . Hypertension Sister    Social History   Socioeconomic History  . Marital status: Widowed    Spouse name: Not on file  . Number of children: Not on file  . Years of education: Not on file  . Highest education level: Not on file  Occupational History  . Not on file  Social Needs  . Financial resource strain: Not hard at all  . Food insecurity:    Worry: Never true    Inability: Never true  . Transportation needs:    Medical: No    Non-medical: No  Tobacco Use  . Smoking status: Former Smoker    Years: 40.00    Types: Cigarettes    Last attempt to quit: 08/15/1983    Years since quitting: 34.1  . Smokeless tobacco: Never Used  Substance and Sexual Activity  . Alcohol  use: Yes    Alcohol/week: 4.2 oz    Types: 7 Glasses of wine per week  . Drug use: No  . Sexual activity: Not on file  Lifestyle  . Physical activity:    Days per week: Not on file    Minutes per session: Not on file  . Stress: Not on file  Relationships  . Social connections:    Talks on phone: Not on file    Gets together: Not on file    Attends religious service: Not on file    Active member of club or organization: Not on file    Attends meetings of clubs or organizations: Not on file    Relationship status: Not on file  Other Topics Concern  . Not on file  Social History Narrative   Exercises 4 times weekly, married IADLs    Outpatient Encounter Medications as of 10/11/2017  Medication Sig  . amLODipine (NORVASC) 5 MG tablet TAKE ONE TABLET BY MOUTH EVERY DAY  . Calcium Carbonate-Vitamin D  (CALCIUM + D) 600-200 MG-UNIT TABS Take by mouth.  Marland Kitchen lisinopril (PRINIVIL,ZESTRIL) 40 MG tablet TAKE ONE TABLET BY MOUTH EVERY DAY  . [DISCONTINUED] predniSONE (DELTASONE) 10 MG tablet 6 tablets on Day 1 , then reduce by 1 tablet daily until gone   No facility-administered encounter medications on file as of 10/11/2017.     Activities of Daily Living In your present state of health, do you have any difficulty performing the following activities: 10/11/2017  Hearing? N  Vision? N  Difficulty concentrating or making decisions? N  Walking or climbing stairs? N  Dressing or bathing? N  Doing errands, shopping? N  Preparing Food and eating ? N  Using the Toilet? N  In the past six months, have you accidently leaked urine? N  Do you have problems with loss of bowel control? N  Managing your Medications? N  Managing your Finances? N  Housekeeping or managing your Housekeeping? N  Some recent data might be hidden    Patient Care Team: Crecencio Mc, MD as PCP - General (Internal Medicine) Christene Lye, MD (General Surgery) Karlyn Agee, MD (Unknown Physician Specialty)    Assessment:   This is a routine wellness examination for Miranda Watkins.  The goal of the wellness visit is to assist the patient how to close the gaps in care and create a preventative care plan for the patient.   The roster of all physicians providing medical care to patient is listed in the Snapshot section of the chart.  Taking calcium VIT D as appropriate/Osteoporosis reviewed.    Safety issues reviewed; Smoke and carbon monoxide detectors in the home. No firearms in the home. Wears seatbelts when driving or riding with others. No violence in the home.  They do not have excessive sun exposure.  Discussed the need for sun protection: hats, long sleeves and the use of sunscreen if there is significant sun exposure.  Patient is alert, normal appearance, oriented to person/place/and time. Correctly  identified the president of the [place] and recalls of 3/3 words.  Performs simple calculations and can read correct time from watch face. Displays appropriate judgement.  No new identified risk were noted.  No failures at ADL's or IADL's.   BMI- discussed the importance of a healthy diet, water intake and the benefits of aerobic exercise. Educational material provided.   24 hour diet recall: Low carb/low cholesterol  Dental- UTD.   Eye- Visual acuity not assessed per patient  preference since they have regular follow up with the ophthalmologist.  Wears corrective lenses.  Sleep patterns- Sleeps 5-6 hours at night.  Wakes feeling rested.    Dexa Scan declined.    Health maintenance gaps- closed.  Patient Concerns: None at this time. Follow up with PCP as needed.  Exercise Activities and Dietary recommendations Current Exercise Habits: Home exercise routine, Type of exercise: calisthenics;walking, Time (Minutes): > 60, Frequency (Times/Week): 2, Weekly Exercise (Minutes/Week): 0, Intensity: Mild(golf)  Goals    . Maintain Healthy Lifestyle     Stay active  Stay hydrated Healthy diet             Fall Risk Fall Risk  10/11/2017 05/02/2014  Falls in the past year? No No   Depression Screen PHQ 2/9 Scores 10/11/2017 11/30/2016 05/02/2014  PHQ - 2 Score 0 0 0  PHQ- 9 Score - 2 -     Cognitive Function MMSE - Mini Mental State Exam 10/11/2017  Orientation to time 5  Orientation to Place 5  Registration 3  Attention/ Calculation 5  Recall 3  Language- name 2 objects 2  Language- repeat 1  Language- follow 3 step command 3  Language- read & follow direction 1  Write a sentence 1  Copy design 1  Total score 30        Immunization History  Administered Date(s) Administered  . Influenza-Unspecified 01/17/2012, 12/31/2013  . Pneumococcal Conjugate-13 10/30/2013  . Pneumococcal Polysaccharide-23 09/14/2012  . Tdap 11/07/2013  . Zoster 09/14/2009   Screening  Tests Health Maintenance  Topic Date Due  . DEXA SCAN  10/12/2018 (Originally 06/06/1994)  . INFLUENZA VACCINE  11/16/2017  . TETANUS/TDAP  11/08/2023  . PNA vac Low Risk Adult  Completed      Plan:    End of life planning; Advance aging; Advanced directives discussed. Copy of current HCPOA/Living Will requested.    I have personally reviewed and noted the following in the patient's chart:   . Medical and social history . Use of alcohol, tobacco or illicit drugs  . Current medications and supplements . Functional ability and status . Nutritional status . Physical activity . Advanced directives . List of other physicians . Hospitalizations, surgeries, and ER visits in previous 12 months . Vitals . Screenings to include cognitive, depression, and falls . Referrals and appointments  In addition, I have reviewed and discussed with patient certain preventive protocols, quality metrics, and best practice recommendations. A written personalized care plan for preventive services as well as general preventive health recommendations were provided to patient.     OBrien-Blaney, Mayson Sterbenz L, LPN  5/39/6728    I have reviewed the above information and agree with above.   Deborra Medina, MD

## 2017-11-07 ENCOUNTER — Ambulatory Visit: Payer: Medicare Other | Admitting: General Surgery

## 2017-11-20 ENCOUNTER — Other Ambulatory Visit: Payer: Self-pay

## 2017-11-20 ENCOUNTER — Telehealth: Payer: Self-pay | Admitting: Internal Medicine

## 2017-11-20 MED ORDER — AMLODIPINE BESYLATE 5 MG PO TABS: 5.0000 mg | ORAL_TABLET | Freq: Every day | ORAL | 1 refills | Status: DC

## 2017-11-20 MED ORDER — LISINOPRIL 40 MG PO TABS: 40.0000 mg | ORAL_TABLET | Freq: Every day | ORAL | 1 refills | Status: DC

## 2017-11-20 NOTE — Telephone Encounter (Signed)
rx refilled and sent to pharmacy.

## 2017-11-20 NOTE — Telephone Encounter (Signed)
Copied from [place] [phone] [id number]. Topic: Quick Communication - Rx Refill/Question >> Nov 20, 2017  9:17 AM Bea Graff, NT wrote: Medication: amLODipine (NORVASC) 5 MG tablet and lisinopril (PRINIVIL,ZESTRIL) 40 MG tablet   Has the patient contacted their pharmacy? Yes.   (Agent: If no, request that the patient contact the pharmacy for the refill.) (Agent: If yes, when and what did the pharmacy advise?)  Preferred Pharmacy (with phone number or street name):   [facility] [place] [facility] [place] [phone] (Phone) [phone] (Fax)      Agent: Please be advised that RX refills may take up to 3 business days. We ask that you follow-up with your pharmacy.

## 2018-01-10 ENCOUNTER — Encounter: Payer: Self-pay | Admitting: *Deleted

## 2018-08-28 ENCOUNTER — Other Ambulatory Visit: Payer: Self-pay | Admitting: Internal Medicine

## 2018-10-17 ENCOUNTER — Ambulatory Visit (INDEPENDENT_AMBULATORY_CARE_PROVIDER_SITE_OTHER): Payer: Medicare Other | Admitting: Internal Medicine

## 2018-10-17 ENCOUNTER — Encounter: Payer: Self-pay | Admitting: Internal Medicine

## 2018-10-17 ENCOUNTER — Other Ambulatory Visit: Payer: Self-pay

## 2018-10-17 ENCOUNTER — Ambulatory Visit (INDEPENDENT_AMBULATORY_CARE_PROVIDER_SITE_OTHER): Payer: Medicare Other

## 2018-10-17 DIAGNOSIS — R5383 Other fatigue: Secondary | ICD-10-CM

## 2018-10-17 DIAGNOSIS — M109 Gout, unspecified: Secondary | ICD-10-CM

## 2018-10-17 DIAGNOSIS — I10 Essential (primary) hypertension: Secondary | ICD-10-CM

## 2018-10-17 DIAGNOSIS — C50412 Malignant neoplasm of upper-outer quadrant of left female breast: Secondary | ICD-10-CM

## 2018-10-17 DIAGNOSIS — C50411 Malignant neoplasm of upper-outer quadrant of right female breast: Secondary | ICD-10-CM

## 2018-10-17 DIAGNOSIS — I6522 Occlusion and stenosis of left carotid artery: Secondary | ICD-10-CM

## 2018-10-17 DIAGNOSIS — Z0000 Encounter for general adult medical examination without abnormal findings: Secondary | ICD-10-CM

## 2018-10-17 MED ORDER — TELMISARTAN 80 MG PO TABS: 80.0000 mg | ORAL_TABLET | Freq: Every day | ORAL | 5 refills | Status: DC

## 2018-10-17 NOTE — Progress Notes (Addendum)
Telephone  Note  This visit type was conducted due to national recommendations for restrictions regarding the COVID-19 pandemic (e.g. social distancing).  This format is felt to be most appropriate for this patient at this time.  All issues noted in this document were discussed and addressed.  No physical exam was performed (except for noted visual exam findings with Video Visits).   I connected with@ on 10/17/18 at 10:00 AM EDT by a video enabled telemedicine application or telephone and verified that I am speaking with the correct person using two identifiers. Location patient: home Location provider: work or home office Persons participating in the virtual visit: patient, provider  I discussed the limitations, risks, security and privacy concerns of performing an evaluation and management service by telephone and the availability of in person appointments. I also discussed with the patient that there may be a patient responsible charge related to this service. The patient expressed understanding and agreed to proceed.  Reason for visit: follow up on hypertension   HPI:  85 yr old female with history of hypertension , PAD and breat cancer last seen in August 2018 presents for follow up  The patient has no signs or symptoms of COVID 19 infection (fever, cough, sore throat  or shortness of breath beyond what is typical for patient).  Patient denies contact with other persons with the above mentioned symptoms or with anyone confirmed to have COVID 19   Hypertension: patient checks blood pressure twice weekly at home.  Readings have been for the most part <130/80 at rest . Patient is following a reduce salt diet most days and is taking medications as prescribed  OA knee: she has been receiving PT for right knee pain and continues to stay active,  Playing golf 3/week until the pandemic,  Now walking daily    Had a basal cell CA removed from face in Feb 2020 by Anne Fu via Mohs procedure .   Has annual follow up with Arin Isenstein   ROS: See pertinent positives and negatives per HPI.  Past Medical History:  Diagnosis Date  . Breast cancer ([place]) 08/2013   Left, 1.8cm , N0,M0/ ER/PR pos Her2 neg/ INVASIVE MAMMARY (DUCTAL) CARCINOMA/Bilateral  . Breast cancer ([place]) 1990   right breast  . Diverticulosis of colon   . Heart murmur 07/02/2003  . Hypertension     Past Surgical History:  Procedure Laterality Date  . BASAL CELL CARCINOMA EXCISION  2014   face  . BREAST SURGERY     bilateral 1991, and 2000  . BREAST SURGERY Bilateral 09/06/13   bilateral mastectomy/tumor size is 1.8cm, SN neg. This is T1c,N0, M0.  Marland Kitchen CATARACT EXTRACTION, BILATERAL     Tobe Sos,  remote,   . CHOLECYSTECTOMY  1996  . COLONOSCOPY  2009   Dr. Vira Agar  . MASTECTOMY, RADICAL Bilateral May 2015  . TONSILLECTOMY  1998    Family History  Problem Relation Age of Onset  . Hypertension Mother   . Cancer Father        lung cancer, smoker  . Hypertension Sister     SOCIAL HX:  reports that she quit smoking about 35 years ago. Her smoking use included cigarettes. She quit after 40.00 years of use. She has never used smokeless tobacco. She reports current alcohol use of about 7.0 standard drinks of alcohol per week. She reports that she does not use drugs.   Current Outpatient Medications:  .  allopurinol (ZYLOPRIM) 300 MG tablet, , Disp: ,  Rfl:  .  amLODipine (NORVASC) 5 MG tablet, TAKE 1 TABLET BY MOUTH DAILY, Disp: 90 tablet, Rfl: 1 .  Calcium Carbonate-Vit D-Min (CALCIUM 1200 PO), Take 1 capsule by mouth daily., Disp: , Rfl:  .  Cranberry 1000 MG CAPS, Take 1 capsule by mouth daily., Disp: , Rfl:  .  Inulin (FIBER CHOICE PO), Take 1 tablet by mouth daily., Disp: , Rfl:  .  telmisartan (MICARDIS) 80 MG tablet, Take 1 tablet (80 mg total) by mouth at bedtime., Disp: 30 tablet, Rfl: 5  EXAM:   General impression: alert, cooperative and articulate.  No signs of being in distress  Lungs: speech is  fluent sentence length suggests that patient is not short of breath and not punctuated by cough, sneezing or sniffing. Marland Kitchen   Psych: affect normal.  speech is articulate and non pressured Denies suicidal thoughts   ASSESSMENT AND PLAN:  Discussed the following assessment and plan:   Carotid stenosis Her last  carotid ultrasound was done in June 2014 showing bilateral disease but not significant to warrant surgery . 1-49% (Schnier)  Hypertension Well controlled on current regimen, but I am making a decision to change patient's ACE Inhibitor to an ARB  based on increased reports of  angioedema.  I also advised patient to to take it at night instead of morning,  as recent studies have shown a reduction in incidence of heart attacks and strokes. 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 Diagnosed in 2015, s/p bilateral mastectomy by Dr. Jamal Collin, finished arimidex.  Sh has deferred follow up due to age   Gouty arthritis Previously managed with allopurinol by rheumatology but has not had an attack in over a year and has not taken the medication in months.  Repeat uric acid level ordered    I discussed the assessment and treatment plan with the patient. The patient was provided an opportunity to ask questions and all were answered. The patient agreed with the plan and demonstrated an understanding of the instructions.   The patient was advised to call back or seek an in-person evaluation if the symptoms worsen or if the condition fails to improve as anticipated.  I provided 30 minutes of non-face-to-face time during this encounter.   Crecencio Mc, MD

## 2018-10-17 NOTE — Assessment & Plan Note (Signed)
Previously managed with allopurinol by rheumatology but has not had an attack in over a year and has not taken the medication in months.  Repeat uric acid level ordered

## 2018-10-17 NOTE — Assessment & Plan Note (Signed)
Diagnosed in 2015, s/p bilateral mastectomy by Dr. Jamal Collin, finished arimidex.  Sh has deferred follow up due to age

## 2018-10-17 NOTE — Progress Notes (Addendum)
Subjective:   Miranda Watkins is a 85 y.o. female who presents for Medicare Annual (Subsequent) preventive examination.  Review of Systems:  No ROS.  Medicare Wellness Virtual Visit.  Visual/audio telehealth visit, UTA vital signs.   See social history for additional risk factors.   Cardiac Risk Factors include: advanced age (>28mn, >>37women);hypertension     Objective:     Vitals: There were no vitals taken for this visit.  There is no height or weight on file to calculate BMI.  Advanced Directives 10/17/2018 10/11/2017 10/06/2014  Does Patient Have a Medical Advance Directive? Yes Yes No  Type of [place] Living will -  Does patient want to make changes to medical advance directive? No - Patient declined No - Patient declined -  [place]? No - copy requested - -  Would patient like information on creating a medical advance directive? - - No - patient declined information    Tobacco Social History   Tobacco Use  Smoking Status Former Smoker  . Years: 40.00  . Types: Cigarettes  . Quit date: 08/15/1983  . Years since quitting: 35.1  Smokeless Tobacco Never Used     Counseling given: Not Answered   Clinical Intake:  Pre-visit preparation completed: Yes        Diabetes: No  How often do you need to have someone help you when you read instructions, pamphlets, or other written materials from your doctor or pharmacy?: 1 - Never  Interpreter Needed?: No     Past Medical History:  Diagnosis Date  . [place]   Left, 1.8cm , N0,M0/ ER/PR pos Her2 neg/ INVASIVE MAMMARY (DUCTAL) CARCINOMA/Bilateral  . [place]   [place]  . Diverticulosis of colon   . Heart murmur 07/02/2003  . Hypertension    Past Surgical History:  Procedure Laterality Date  . BASAL CELL CARCINOMA EXCISION  2014   face  . BREAST SURGERY     bilateral 1991, and 2000   . BREAST SURGERY Bilateral 09/06/13   bilateral mastectomy/tumor size is 1.8cm, SN neg. This is T1c,N0, M0.  .Marland KitchenCATARACT EXTRACTION, BILATERAL     BTobe Sos  remote,   . CHOLECYSTECTOMY  1996  . COLONOSCOPY  2009   Dr. EVira Agar . MASTECTOMY, RADICAL Bilateral May 2015  . TONSILLECTOMY  1998   Family History  Problem Relation Age of Onset  . Hypertension Mother   . Cancer Father        lung cancer, smoker  . Hypertension Sister    Social History   Socioeconomic History  . Marital status: Widowed    Spouse name: Not on file  . Number of children: Not on file  . Years of education: Not on file  . Highest education level: Not on file  Occupational History  . Not on file  Social Needs  . Financial resource strain: Not hard at all  . Food insecurity    Worry: Never true    Inability: Never true  . Transportation needs    Medical: No    Non-medical: No  Tobacco Use  . Smoking status: Former Smoker    Years: 40.00    Types: Cigarettes    Quit date: 08/15/1983    Years since quitting: 35.1  . Smokeless tobacco: Never Used  Substance and Sexual Activity  . Alcohol use: Yes    Alcohol/week: 7.0 standard drinks  Types: 7 Glasses of wine per week  . Drug use: No  . Sexual activity: Not on file  Lifestyle  . Physical activity    Days per week: 4 days    Minutes per session: 20 min  . Stress: Not at all  Relationships  . Social Herbalist on phone: Not on file    Gets together: Not on file    Attends religious service: Not on file    Active member of club or organization: Not on file    Attends meetings of clubs or organizations: Not on file    Relationship status: Not on file  Other Topics Concern  . Not on file  Social History Narrative   Exercises 4 times weekly, IADLs    Outpatient Encounter Medications as of 10/17/2018  Medication Sig  . allopurinol (ZYLOPRIM) 300 MG tablet   . amLODipine (NORVASC) 5 MG tablet TAKE 1 TABLET BY MOUTH DAILY  . Calcium  Carbonate-Vit D-Min (CALCIUM 1200 PO) Take 1 capsule by mouth daily.  . [DISCONTINUED] telmisartan (MICARDIS) 80 MG tablet Take 1 tablet (80 mg total) by mouth at bedtime.   No facility-administered encounter medications on file as of 10/17/2018.     Activities of Daily Living In your present state of health, do you have any difficulty performing the following activities: 10/17/2018  Hearing? N  Vision? N  Difficulty concentrating or making decisions? N  Walking or climbing stairs? N  Dressing or bathing? N  Doing errands, shopping? N  Preparing Food and eating ? N  Using the Toilet? N  In the past six months, have you accidently leaked urine? N  Do you have problems with loss of bowel control? N  Managing your Medications? N  Managing your Finances? N  Housekeeping or managing your Housekeeping? N  Some recent data might be hidden    Patient Care Team: Crecencio Mc, MD as PCP - General (Internal Medicine) Christene Lye, MD (General Surgery) Karlyn Agee, MD (Unknown Physician Specialty)    Assessment:   This is a routine wellness examination for Miranda Watkins.  I connected with patient 10/17/18 at 10:30 AM EDT by a video/audio enabled telemedicine application and verified that I am speaking with the correct person using two identifiers. Patient stated full name and DOB. Patient gave permission to continue with virtual visit. Patient's location was at home and Nurse's location was at [place] office.   Health Screenings  Colonoscopy - 07/2005 Bone Density - discussed Glaucoma -none Hearing -demonstrates normal hearing during visit. Labs followed by pcp Cholesterol - 11/2016 Dental- visits every 12 months Vision- visits within the last 12 months.  Social  Alcohol intake - yes      Smoking history- former  Smokers in home? none Illicit drug use? none Physical activity- active around the home Diet - regular Sexually Active -not currently BMI- discussed the  importance of a healthy diet, water intake and the benefits of aerobic exercise.  Educational material provided.   Safety  Patient feels safe at home- yes Patient does have smoke detectors at home- yes Patient does wear sunscreen or protective clothing when in direct sunlight -yes Patient does wear seat belt when in a moving vehicle -yes Patient drives- yes Daughter lives 2 blocks away and calls every morning.  Sister in law lives 1 block away with routine socializing.   Covid-19 precautions and sickness symptoms discussed.   Activities of Daily Living Patient denies needing assistance with: driving, feeding  themselves, getting from bed to chair, getting to the toilet, bathing/showering, dressing, managing money, or preparing meals.  Maid assist with household chores.   Depression Screen Patient denies losing interest in daily life, feeling hopeless, or crying easily over simple problems.   Medication-taking as directed and without issues.   Fall Screen Patient denies being afraid of falling or falling in the last year.   Memory Screen Patient is alert.  Correctly identified the president of the [place], season and recall. Patient likes to read novels, sodoku, and completes crossword puzzles for brain stimulation.  Immunizations The following Immunizations were discussed: Influenza, shingles, pneumonia, and tetanus.   Other Providers Patient Care Team: Crecencio Mc, MD as PCP - General (Internal Medicine) Christene Lye, MD (General Surgery) Karlyn Agee, MD (Unknown Physician Specialty)  Exercise Activities and Dietary recommendations Current Exercise Habits: Home exercise routine, Intensity: Mild  Goals      Patient Stated   . Maintain Healthy Lifestyle (pt-stated)     Stay active  Stay hydrated Healthy diet             Fall Risk Fall Risk  10/17/2018 10/11/2017 05/02/2014  Falls in the past year? 0 No No   Depression Screen PHQ 2/9  Scores 10/17/2018 10/11/2017 11/30/2016 05/02/2014  PHQ - 2 Score 0 0 0 0  PHQ- 9 Score - - 2 -     Cognitive Function MMSE - Mini Mental State Exam 10/11/2017  Orientation to time 5  Orientation to Place 5  Registration 3  Attention/ Calculation 5  Recall 3  Language- name 2 objects 2  Language- repeat 1  Language- follow 3 step command 3  Language- read & follow direction 1  Write a sentence 1  Copy design 1  Total score 30     6CIT Screen 10/17/2018  What Year? 0 points  What month? 0 points  What time? 0 points  Count back from 20 0 points  Months in reverse 0 points  Repeat phrase 0 points  Total Score 0    Immunization History  Administered Date(s) Administered  . Influenza, High Dose Seasonal PF 01/24/2018  . Influenza-Unspecified 01/17/2012, 12/31/2013  . Pneumococcal Conjugate-13 10/30/2013  . Pneumococcal Polysaccharide-23 09/14/2012  . Tdap 11/07/2013  . Zoster 09/14/2009  . Zoster Recombinat (Shingrix) 12/13/2017, 02/20/2018   Screening Tests Health Maintenance  Topic Date Due  . DEXA SCAN  06/06/1994  . INFLUENZA VACCINE  11/17/2018  . TETANUS/TDAP  11/08/2023  . PNA vac Low Risk Adult  Completed      Plan:    End of life planning; Advance aging; Advanced directives discussed.  Copy of current HCPOA/Living Will requested.    I have personally reviewed and noted the following in the patient's chart:   . Medical and social history . Use of alcohol, tobacco or illicit drugs  . Current medications and supplements . Functional ability and status . Nutritional status . Physical activity . Advanced directives . List of other physicians . Hospitalizations, surgeries, and ER visits in previous 12 months . Vitals . Screenings to include cognitive, depression, and falls . Referrals and appointments  In addition, I have reviewed and discussed with patient certain preventive protocols, quality metrics, and best practice recommendations. A written  personalized care plan for preventive services as well as general preventive health recommendations were provided to patient.     OBrien-Blaney, Denisa L, LPN  04/23/1094     I have reviewed the above information and agree  with above.   Deborra Medina, MD

## 2018-10-17 NOTE — Assessment & Plan Note (Addendum)
Well controlled on current regimen, but I am making a decision to change patient's ACE Inhibitor to an ARB  based on increased reports of  angioedema.  I also advised patient to to take it at night instead of morning,  as recent studies have shown a reduction in incidence of heart attacks and strokes. She is overdue for assessment of renal function

## 2018-10-17 NOTE — Assessment & Plan Note (Addendum)
Her last  carotid ultrasound was done in June 2014 showing bilateral disease but not significant to warrant surgery . 1-49% (Schnier)

## 2018-10-17 NOTE — Patient Instructions (Addendum)
  Miranda Watkins , Thank you for taking time to come for your Medicare Wellness Visit. I appreciate your ongoing commitment to your health goals. Please review the following plan we discussed and let me know if I can assist you in the future.   These are the goals we discussed: Goals      Patient Stated   . Maintain Healthy Lifestyle (pt-stated)     Stay active  Stay hydrated Healthy diet             This is a list of the screening recommended for you and due dates:  Health Maintenance  Topic Date Due  . DEXA scan (bone density measurement)  06/06/1994  . Flu Shot  11/17/2018  . Tetanus Vaccine  11/08/2023  . Pneumonia vaccines  Completed

## 2018-10-17 NOTE — Patient Instructions (Addendum)
   I am making a decision to change your lisinopril to telmisartan, based on increased reports by one of my ENT colleagues of patients  developing tongue and throat swelling from lisinopril.  The condition , called "angioedema," can be fatal if a person's airway is compromised.  I also want you to take it at night instead of morning,  as recent studies have shown that taking your blood pressure medications at night protects you better from heart attacks and strokes.   cotinue taking amlodipine in the morning and take wither the lisinopril or the new medication at night.    Miranda Watkins  Will set you up for a lab appointment this summer

## 2018-10-31 ENCOUNTER — Other Ambulatory Visit: Payer: Self-pay

## 2018-10-31 ENCOUNTER — Other Ambulatory Visit (INDEPENDENT_AMBULATORY_CARE_PROVIDER_SITE_OTHER): Payer: Medicare Other

## 2018-10-31 DIAGNOSIS — I10 Essential (primary) hypertension: Secondary | ICD-10-CM

## 2018-10-31 DIAGNOSIS — M109 Gout, unspecified: Secondary | ICD-10-CM

## 2018-10-31 DIAGNOSIS — R5383 Other fatigue: Secondary | ICD-10-CM

## 2018-10-31 LAB — COMPREHENSIVE METABOLIC PANEL
ALT: 13 U/L (ref 0–35)
AST: 17 U/L (ref 0–37)
Albumin: 4.4 g/dL (ref 3.5–5.2)
Alkaline Phosphatase: 74 U/L (ref 39–117)
BUN: 11 mg/dL (ref 6–23)
CO2: 26 mEq/L (ref 19–32)
Calcium: 10 mg/dL (ref 8.4–10.5)
Chloride: 103 mEq/L (ref 96–112)
Creatinine, Ser: 0.69 mg/dL (ref 0.40–1.20)
GFR: 80.04 mL/min (ref >60.00)
Glucose, Bld: 99 mg/dL (ref 70–99)
Potassium: 4 mEq/L (ref 3.5–5.1)
Sodium: 140 mEq/L (ref 135–145)
Total Bilirubin: 0.5 mg/dL (ref 0.2–1.2)
Total Protein: 6.5 g/dL (ref 6.0–8.3)

## 2018-10-31 LAB — CBC WITH DIFFERENTIAL/PLATELET
Basophils Absolute: 0 10*3/uL (ref 0.0–0.1)
Basophils Relative: 0.4 % (ref 0.0–3.0)
Eosinophils Absolute: 0.3 10*3/uL (ref 0.0–0.7)
Eosinophils Relative: 3.1 % (ref 0.0–5.0)
HCT: 42.1 % (ref 36.0–46.0)
Hemoglobin: 13.9 g/dL (ref 12.0–15.0)
Lymphocytes Relative: 29 % (ref 12.0–46.0)
Lymphs Abs: 2.9 10*3/uL (ref 0.7–4.0)
MCHC: 32.9 g/dL (ref 30.0–36.0)
MCV: 92.9 fl (ref 78.0–100.0)
Monocytes Absolute: 1 10*3/uL (ref 0.1–1.0)
Monocytes Relative: 9.7 % (ref 3.0–12.0)
Neutro Abs: 5.7 10*3/uL (ref 1.4–7.7)
Neutrophils Relative %: 57.8 % (ref 43.0–77.0)
Platelets: 266 10*3/uL (ref 150.0–400.0)
RBC: 4.53 Mil/uL (ref 3.87–5.11)
RDW: 14.9 % (ref 11.5–15.5)
WBC: 9.9 10*3/uL (ref 4.0–10.5)

## 2018-10-31 LAB — URIC ACID: Uric Acid, Serum: 2.9 mg/dL (ref 2.4–7.0)

## 2018-10-31 LAB — TSH: TSH: 3.08 u[IU]/mL (ref 0.35–4.50)

## 2018-11-20 ENCOUNTER — Other Ambulatory Visit: Payer: Self-pay | Admitting: Internal Medicine

## 2018-12-14 ENCOUNTER — Other Ambulatory Visit: Payer: Self-pay

## 2018-12-14 ENCOUNTER — Ambulatory Visit (INDEPENDENT_AMBULATORY_CARE_PROVIDER_SITE_OTHER): Payer: Medicare Other

## 2018-12-14 DIAGNOSIS — Z23 Encounter for immunization: Secondary | ICD-10-CM

## 2019-02-19 ENCOUNTER — Other Ambulatory Visit: Payer: Self-pay | Admitting: Internal Medicine

## 2019-02-19 NOTE — Telephone Encounter (Signed)
Medication Refill - Medication: amLODipine (NORVASC) 5 MG tablet [id number]    Has the patient contacted their pharmacy? No. (Agent: If no, request that the patient contact the pharmacy for the refill.) (Agent: If yes, when and what did the pharmacy advise?)  Preferred Pharmacy (with phone number or street name): [facility]   Agent: Please be advised that RX refills may take up to 3 business days. We ask that you follow-up with your pharmacy.

## 2019-03-11 ENCOUNTER — Other Ambulatory Visit: Payer: Self-pay

## 2019-03-11 DIAGNOSIS — Z20822 Contact with and (suspected) exposure to covid-19: Secondary | ICD-10-CM

## 2019-03-13 LAB — NOVEL CORONAVIRUS, NAA: SARS-CoV-2, NAA: NOT DETECTED

## 2019-04-08 ENCOUNTER — Ambulatory Visit: Payer: Medicare Other | Attending: Internal Medicine

## 2019-04-08 DIAGNOSIS — Z20822 Contact with and (suspected) exposure to covid-19: Secondary | ICD-10-CM

## 2019-04-09 LAB — NOVEL CORONAVIRUS, NAA: SARS-CoV-2, NAA: NOT DETECTED

## 2019-04-10 ENCOUNTER — Telehealth: Payer: Self-pay

## 2019-04-10 NOTE — Telephone Encounter (Signed)
Pt notified of negative COVID-19 results. Understanding verbalized.  Chasta M Hopkins   

## 2019-05-07 DIAGNOSIS — M1A00X0 Idiopathic chronic gout, unspecified site, without tophus (tophi): Secondary | ICD-10-CM | POA: Diagnosis not present

## 2019-05-07 DIAGNOSIS — Z79899 Other long term (current) drug therapy: Secondary | ICD-10-CM | POA: Diagnosis not present

## 2019-05-07 DIAGNOSIS — M25552 Pain in left hip: Secondary | ICD-10-CM | POA: Diagnosis not present

## 2019-05-07 DIAGNOSIS — M1712 Unilateral primary osteoarthritis, left knee: Secondary | ICD-10-CM | POA: Diagnosis not present

## 2019-05-20 ENCOUNTER — Other Ambulatory Visit: Payer: Self-pay | Admitting: Internal Medicine

## 2019-05-22 DIAGNOSIS — X32XXXA Exposure to sunlight, initial encounter: Secondary | ICD-10-CM | POA: Diagnosis not present

## 2019-05-22 DIAGNOSIS — L570 Actinic keratosis: Secondary | ICD-10-CM | POA: Diagnosis not present

## 2019-05-22 DIAGNOSIS — D485 Neoplasm of uncertain behavior of skin: Secondary | ICD-10-CM | POA: Diagnosis not present

## 2019-05-22 DIAGNOSIS — L821 Other seborrheic keratosis: Secondary | ICD-10-CM | POA: Diagnosis not present

## 2019-05-22 DIAGNOSIS — Z85828 Personal history of other malignant neoplasm of skin: Secondary | ICD-10-CM | POA: Diagnosis not present

## 2019-05-22 DIAGNOSIS — C44612 Basal cell carcinoma of skin of right upper limb, including shoulder: Secondary | ICD-10-CM | POA: Diagnosis not present

## 2019-05-22 DIAGNOSIS — D225 Melanocytic nevi of trunk: Secondary | ICD-10-CM | POA: Diagnosis not present

## 2019-05-22 DIAGNOSIS — Z08 Encounter for follow-up examination after completed treatment for malignant neoplasm: Secondary | ICD-10-CM | POA: Diagnosis not present

## 2019-05-30 DIAGNOSIS — C44612 Basal cell carcinoma of skin of right upper limb, including shoulder: Secondary | ICD-10-CM | POA: Diagnosis not present

## 2019-06-13 ENCOUNTER — Other Ambulatory Visit: Payer: Self-pay | Admitting: Internal Medicine

## 2019-07-17 DIAGNOSIS — M7062 Trochanteric bursitis, left hip: Secondary | ICD-10-CM | POA: Diagnosis not present

## 2019-07-17 DIAGNOSIS — M8949 Other hypertrophic osteoarthropathy, multiple sites: Secondary | ICD-10-CM | POA: Diagnosis not present

## 2019-07-26 ENCOUNTER — Telehealth: Payer: Self-pay | Admitting: Internal Medicine

## 2019-07-26 MED ORDER — AMLODIPINE BESYLATE 5 MG PO TABS: 5.0000 mg | ORAL_TABLET | Freq: Every day | ORAL | 1 refills | Status: DC

## 2019-07-26 MED ORDER — TELMISARTAN 80 MG PO TABS: 80.0000 mg | ORAL_TABLET | Freq: Every day | ORAL | 5 refills | Status: DC

## 2019-07-26 NOTE — Telephone Encounter (Signed)
Pt needs amlodipine and telmisartan to refilled and sent to [facility].

## 2019-07-29 ENCOUNTER — Telehealth: Payer: Self-pay | Admitting: Internal Medicine

## 2019-07-29 MED ORDER — ALLOPURINOL 300 MG PO TABS: 300.0000 mg | ORAL_TABLET | Freq: Every day | ORAL | 1 refills | Status: DC

## 2019-07-29 NOTE — Telephone Encounter (Signed)
Pt needs a refill on allopurinol (ZYLOPRIM) 300 MG tablet sent to [facility]

## 2019-08-12 DIAGNOSIS — Z79899 Other long term (current) drug therapy: Secondary | ICD-10-CM | POA: Diagnosis not present

## 2019-08-12 DIAGNOSIS — M1A00X0 Idiopathic chronic gout, unspecified site, without tophus (tophi): Secondary | ICD-10-CM | POA: Diagnosis not present

## 2019-08-12 DIAGNOSIS — M1712 Unilateral primary osteoarthritis, left knee: Secondary | ICD-10-CM | POA: Diagnosis not present

## 2019-08-12 DIAGNOSIS — M7062 Trochanteric bursitis, left hip: Secondary | ICD-10-CM | POA: Diagnosis not present

## 2019-08-12 DIAGNOSIS — R252 Cramp and spasm: Secondary | ICD-10-CM | POA: Diagnosis not present

## 2019-08-20 ENCOUNTER — Telehealth: Payer: Self-pay | Admitting: Internal Medicine

## 2019-08-20 NOTE — Telephone Encounter (Signed)
Pt called wanted to know about her medication

## 2019-08-22 NOTE — Telephone Encounter (Signed)
Patient called and wanted to go over which prescriptions Dr. Derrel Nip prescribed She wanted to know if micardis has refills & advised that she does.

## 2019-09-05 DIAGNOSIS — M1712 Unilateral primary osteoarthritis, left knee: Secondary | ICD-10-CM | POA: Diagnosis not present

## 2019-09-05 DIAGNOSIS — M7062 Trochanteric bursitis, left hip: Secondary | ICD-10-CM | POA: Diagnosis not present

## 2019-10-16 DIAGNOSIS — D692 Other nonthrombocytopenic purpura: Secondary | ICD-10-CM | POA: Diagnosis not present

## 2019-10-16 DIAGNOSIS — D485 Neoplasm of uncertain behavior of skin: Secondary | ICD-10-CM | POA: Diagnosis not present

## 2019-10-16 DIAGNOSIS — Z85828 Personal history of other malignant neoplasm of skin: Secondary | ICD-10-CM | POA: Diagnosis not present

## 2019-10-16 DIAGNOSIS — D0472 Carcinoma in situ of skin of left lower limb, including hip: Secondary | ICD-10-CM | POA: Diagnosis not present

## 2019-10-16 DIAGNOSIS — Z08 Encounter for follow-up examination after completed treatment for malignant neoplasm: Secondary | ICD-10-CM | POA: Diagnosis not present

## 2019-10-18 ENCOUNTER — Ambulatory Visit (INDEPENDENT_AMBULATORY_CARE_PROVIDER_SITE_OTHER): Payer: Medicare PPO

## 2019-10-18 VITALS — Ht 62.0 in | Wt 124.0 lb

## 2019-10-18 DIAGNOSIS — Z0000 Encounter for general adult medical examination without abnormal findings: Secondary | ICD-10-CM

## 2019-10-18 NOTE — Patient Instructions (Addendum)
Miranda Watkins , Thank you for taking time to come for your Medicare Wellness Visit. I appreciate your ongoing commitment to your health goals. Please review the following plan we discussed and let me know if I can assist you in the future.   These are the goals we discussed: Goals      Patient Stated   .  Maintain Healthy Lifestyle (pt-stated)      Stay active  Stay hydrated Healthy diet             This is a list of the screening recommended for you and due dates:  Health Maintenance  Topic Date Due  . DEXA scan (bone density measurement)  10/17/2020*  . Flu Shot  11/17/2019  . Tetanus Vaccine  11/08/2023  . COVID-19 Vaccine  Completed  . Pneumonia vaccines  Completed  *Topic was postponed. The date shown is not the original due date.    Immunizations Immunization History  Administered Date(s) Administered  . Fluad Quad(high Dose 65+) 12/14/2018  . Influenza, High Dose Seasonal PF 01/24/2018  . Influenza-Unspecified 01/17/2012, 12/31/2013  . PFIZER SARS-COV-2 Vaccination 05/15/2019, 06/05/2019  . Pneumococcal Conjugate-13 10/30/2013  . Pneumococcal Polysaccharide-23 09/14/2012  . Tdap 11/07/2013  . Zoster 09/14/2009  . Zoster Recombinat (Shingrix) 12/13/2017, 02/20/2018   Advanced directives: yes on file  Conditions/risks identified: none new  Follow up in one year for your annual wellness visit.   Preventive Care 57 Years and Older, Female Preventive care refers to lifestyle choices and visits with your health care provider that can promote health and wellness. What does preventive care include?  A yearly physical exam. This is also called an annual well check.  Dental exams once or twice a year.  Routine eye exams. Ask your health care provider how often you should have your eyes checked.  Personal lifestyle choices, including:  Daily care of your teeth and gums.  Regular physical activity.  Eating a healthy diet.  Avoiding tobacco and drug  use.  Limiting alcohol use.  Practicing safe sex.  Taking low-dose aspirin every day.  Taking vitamin and mineral supplements as recommended by your health care provider. What happens during an annual well check? The services and screenings done by your health care provider during your annual well check will depend on your age, overall health, lifestyle risk factors, and family history of disease. Counseling  Your health care provider may ask you questions about your:  Alcohol use.  Tobacco use.  Drug use.  Emotional well-being.  Home and relationship well-being.  Sexual activity.  Eating habits.  History of falls.  Memory and ability to understand (cognition).  Work and work Statistician.  Reproductive health. Screening  You may have the following tests or measurements:  Height, weight, and BMI.  Blood pressure.  Lipid and cholesterol levels. These may be checked every 5 years, or more frequently if you are over 44 years old.  Skin check.  Lung cancer screening. You may have this screening every year starting at age 81 if you have a 30-pack-year history of smoking and currently smoke or have quit within the past 15 years.  Fecal occult blood test (FOBT) of the stool. You may have this test every year starting at age 64.  Flexible sigmoidoscopy or colonoscopy. You may have a sigmoidoscopy every 5 years or a colonoscopy every 10 years starting at age 14.  Hepatitis C blood test.  Hepatitis B blood test.  Sexually transmitted disease (STD) testing.  Diabetes screening. This  is done by checking your blood sugar (glucose) after you have not eaten for a while (fasting). You may have this done every 1-3 years.  Bone density scan. This is done to screen for osteoporosis. You may have this done starting at age 28.  Mammogram. This may be done every 1-2 years. Talk to your health care provider about how often you should have regular mammograms. Talk with your  health care provider about your test results, treatment options, and if necessary, the need for more tests. Vaccines  Your health care provider may recommend certain vaccines, such as:  Influenza vaccine. This is recommended every year.  Tetanus, diphtheria, and acellular pertussis (Tdap, Td) vaccine. You may need a Td booster every 10 years.  Zoster vaccine. You may need this after age 83.  Pneumococcal 13-valent conjugate (PCV13) vaccine. One dose is recommended after age 21.  Pneumococcal polysaccharide (PPSV23) vaccine. One dose is recommended after age 7. Talk to your health care provider about which screenings and vaccines you need and how often you need them. This information is not intended to replace advice given to you by your health care provider. Make sure you discuss any questions you have with your health care provider. Document Released: 05/01/2015 Document Revised: 12/23/2015 Document Reviewed: 02/03/2015 Elsevier Interactive Patient Education  2017 [place] [employer] Prevention in the Home Falls can cause injuries. They can happen to people of all ages. There are many things you can do to make your home safe and to help prevent falls. What can I do on the outside of my home?  Regularly fix the edges of walkways and driveways and fix any cracks.  Remove anything that might make you trip as you walk through a door, such as a raised step or threshold.  Trim any bushes or trees on the path to your home.  Use bright outdoor lighting.  Clear any walking paths of anything that might make someone trip, such as rocks or tools.  Regularly check to see if handrails are loose or broken. Make sure that both sides of any steps have handrails.  Any raised decks and porches should have guardrails on the edges.  Have any leaves, snow, or ice cleared regularly.  Use sand or salt on walking paths during winter.  Clean up any spills in your garage right away. This includes oil  or grease spills. What can I do in the bathroom?  Use night lights.  Install grab bars by the toilet and in the tub and shower. Do not use towel bars as grab bars.  Use non-skid mats or decals in the tub or shower.  If you need to sit down in the shower, use a plastic, non-slip stool.  Keep the floor dry. Clean up any water that spills on the floor as soon as it happens.  Remove soap buildup in the tub or shower regularly.  Attach bath mats securely with double-sided non-slip rug tape.  Do not have throw rugs and other things on the floor that can make you trip. What can I do in the bedroom?  Use night lights.  Make sure that you have a light by your bed that is easy to reach.  Do not use any sheets or blankets that are too big for your bed. They should not hang down onto the floor.  Have a firm chair that has side arms. You can use this for support while you get dressed.  Do not have throw rugs and other  things on the floor that can make you trip. What can I do in the kitchen?  Clean up any spills right away.  Avoid walking on wet floors.  Keep items that you use a lot in easy-to-reach places.  If you need to reach something above you, use a strong step stool that has a grab bar.  Keep electrical cords out of the way.  Do not use floor polish or wax that makes floors slippery. If you must use wax, use non-skid floor wax.  Do not have throw rugs and other things on the floor that can make you trip. What can I do with my stairs?  Do not leave any items on the stairs.  Make sure that there are handrails on both sides of the stairs and use them. Fix handrails that are broken or loose. Make sure that handrails are as long as the stairways.  Check any carpeting to make sure that it is firmly attached to the stairs. Fix any carpet that is loose or worn.  Avoid having throw rugs at the top or bottom of the stairs. If you do have throw rugs, attach them to the floor with  carpet tape.  Make sure that you have a light switch at the top of the stairs and the bottom of the stairs. If you do not have them, ask someone to add them for you. What else can I do to help prevent falls?  Wear shoes that:  Do not have high heels.  Have rubber bottoms.  Are comfortable and fit you well.  Are closed at the toe. Do not wear sandals.  If you use a stepladder:  Make sure that it is fully opened. Do not climb a closed stepladder.  Make sure that both sides of the stepladder are locked into place.  Ask someone to hold it for you, if possible.  Clearly mark and make sure that you can see:  Any grab bars or handrails.  First and last steps.  Where the edge of each step is.  Use tools that help you move around (mobility aids) if they are needed. These include:  Canes.  Walkers.  Scooters.  Crutches.  Turn on the lights when you go into a dark area. Replace any light bulbs as soon as they burn out.  Set up your furniture so you have a clear path. Avoid moving your furniture around.  If any of your floors are uneven, fix them.  If there are any pets around you, be aware of where they are.  Review your medicines with your doctor. Some medicines can make you feel dizzy. This can increase your chance of falling. Ask your doctor what other things that you can do to help prevent falls. This information is not intended to replace advice given to you by your health care provider. Make sure you discuss any questions you have with your health care provider. Document Released: 01/29/2009 Document Revised: 09/10/2015 Document Reviewed: 05/09/2014 Elsevier Interactive Patient Education  2017 [employer].

## 2019-10-18 NOTE — Progress Notes (Addendum)
Subjective:   Miranda Watkins is a 86 y.o. female who presents for Medicare Annual (Subsequent) preventive examination.  Review of Systems    No ROS.  Medicare Wellness Virtual Visit.   Cardiac Risk Factors include: advanced age (>50mn, >>10women);hypertension     Objective:    Today's Vitals   10/18/19 1033  Weight: 124 lb (56.2 kg)  Height: '5\' 2"'$  (1.575 m)   Body mass index is 22.68 kg/m.  Advanced Directives 10/18/2019 10/17/2018 10/11/2017 10/06/2014  Does Patient Have a Medical Advance Directive? Yes Yes Yes No  Type of AParamedicof ANewellLiving will HClareLiving will Living will -  Does patient want to make changes to medical advance directive? No - Patient declined No - Patient declined No - Patient declined -  Copy of [place] Chart? Yes - validated most recent copy scanned in chart (See row information) No - copy requested - -  Would patient like information on creating a medical advance directive? - - - No - patient declined information    Current Medications (verified) Outpatient Encounter Medications as of 10/18/2019  Medication Sig  . allopurinol (ZYLOPRIM) 300 MG tablet Take 1 tablet (300 mg total) by mouth daily.  .Marland KitchenamLODipine (NORVASC) 5 MG tablet Take 1 tablet (5 mg total) by mouth daily.  . Calcium Carbonate-Vit D-Min (CALCIUM 1200 PO) Take 1 capsule by mouth daily.  . Cranberry 1000 MG CAPS Take 1 capsule by mouth daily.  . Inulin (FIBER CHOICE PO) Take 1 tablet by mouth daily.  .Marland Kitchentelmisartan (MICARDIS) 80 MG tablet Take 1 tablet (80 mg total) by mouth at bedtime.   No facility-administered encounter medications on file as of 10/18/2019.    Allergies (verified) Arimidex [anastrozole]   History: Past Medical History:  Diagnosis Date  . 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 [place]    right breast  . Diverticulosis of colon   . Heart murmur 07/02/2003  . Hypertension    Past Surgical History:  Procedure Laterality Date  . BASAL CELL CARCINOMA EXCISION  2014   face  . BREAST SURGERY     bilateral 1991, and 2000  . BREAST SURGERY Bilateral 09/06/13   bilateral mastectomy/tumor size is 1.8cm, SN neg. This is T1c,N0, M0.  .Marland KitchenCATARACT EXTRACTION, BILATERAL     BTobe Sos  remote,   . CHOLECYSTECTOMY  1996  . COLONOSCOPY  2009   Dr. EVira Agar . MASTECTOMY, RADICAL Bilateral May 2015  . TONSILLECTOMY  1998   Family History  Problem Relation Age of Onset  . Hypertension Mother   . Cancer Father        lung cancer, smoker  . Hypertension Sister    Social History   Socioeconomic History  . Marital status: Widowed    Spouse name: Not on file  . Number of children: Not on file  . Years of education: Not on file  . Highest education level: Not on file  Occupational History  . Not on file  Tobacco Use  . Smoking status: Former Smoker    Years: 40.00    Types: Cigarettes    Quit date: 08/15/1983    Years since quitting: 36.2  . Smokeless tobacco: Never Used  Vaping Use  . Vaping Use: Never used  Substance and Sexual Activity  . Alcohol use: Yes    Alcohol/week: 7.0  standard drinks    Types: 7 Glasses of wine per week  . Drug use: No  . Sexual activity: Not on file  Other Topics Concern  . Not on file  Social History Narrative   Exercises 4 times weekly, IADLs   Social Determinants of Health   Financial Resource Strain:   . Difficulty of Paying Living Expenses:   Food Insecurity:   . Worried About Charity fundraiser in the Last Year:   . Arboriculturist in the Last Year:   Transportation Needs:   . Film/video editor (Medical):   Marland Kitchen Lack of Transportation (Non-Medical):   Physical Activity:   . Days of Exercise per Week:   . Minutes of Exercise per Session:   Stress:   . Feeling of Stress :   Social Connections:   . Frequency of Communication  with Friends and Family:   . Frequency of Social Gatherings with Friends and Family:   . Attends Religious Services:   . Active Member of Clubs or Organizations:   . Attends Archivist Meetings:   Marland Kitchen Marital Status:     Tobacco Counseling Counseling given: Not Answered   Clinical Intake:  Pre-visit preparation completed: Yes        Diabetes: No  How often do you need to have someone help you when you read instructions, pamphlets, or other written materials from your doctor or pharmacy?: 1 - Never  Interpreter Needed?: No      Activities of Daily Living In your present state of health, do you have any difficulty performing the following activities: 10/18/2019  Hearing? N  Vision? N  Difficulty concentrating or making decisions? N  Walking or climbing stairs? N  Dressing or bathing? N  Doing errands, shopping? N  Preparing Food and eating ? N  Using the Toilet? N  In the past six months, have you accidently leaked urine? N  Do you have problems with loss of bowel control? N  Managing your Medications? N  Managing your Finances? N  Housekeeping or managing your Housekeeping? Y  Some recent data might be hidden    Patient Care Team: Crecencio Mc, MD as PCP - General (Internal Medicine) Christene Lye, MD (General Surgery) Karlyn Agee, MD (Unknown Physician Specialty)  Indicate any recent Medical Services you may have received from other than [facility] providers in the past year (date may be approximate).     Assessment:   This is a routine wellness examination for Miranda Watkins.  I connected with Miranda Watkins today by telephone and verified that I am speaking with the correct person using two identifiers. Location patient: home Location provider: work Persons participating in the virtual visit: patient, Marine scientist.    I discussed the limitations, risks, security and privacy concerns of performing an evaluation and management service by telephone and the  availability of in person appointments. The patient expressed understanding and verbally consented to this telephonic visit.    Interactive audio and video telecommunications were attempted between this provider and patient, however failed, due to patient having technical difficulties OR patient did not have access to video capability.  We continued and completed visit with audio only.  Some vital signs may be absent or patient reported.   Hearing/Vision screen  Hearing Screening   '125Hz'$  '250Hz'$  '500Hz'$  '1000Hz'$  '2000Hz'$  '3000Hz'$  '4000Hz'$  '6000Hz'$  '8000Hz'$   Right ear:           Left ear:  Comments: Patient is able to hear conversational tones without difficulty.  No issues reported.  Vision Screening Comments: Followed by [facility] Wears corrective lenses Visual acuity not assessed, virtual visit.  They have seen their ophthalmologist in the last 12 months.     Dietary issues and exercise activities discussed: Current Exercise Habits: Home exercise routine  Goals      Patient Stated   .  Maintain Healthy Lifestyle (pt-stated)      Stay active  Stay hydrated Healthy diet            Depression Screen PHQ 2/9 Scores 10/18/2019 10/17/2018 10/11/2017 11/30/2016 05/02/2014  PHQ - 2 Score 0 0 0 0 0  PHQ- 9 Score - - - 2 -    Fall Risk Fall Risk  10/18/2019 10/17/2018 10/11/2017 05/02/2014  Falls in the past year? 0 0 No No  Number falls in past yr: 0 - - -  Follow up Falls evaluation completed - - -    Handrails in use when using stairs? Yes  Home free of loose throw rugs in walkways, pet beds, electrical cords, etc? Yes  Adequate lighting in your home to reduce risk of falls? Yes   ASSISTIVE DEVICES UTILIZED TO PREVENT FALLS:  Life alert? No  Use of a cane, walker or w/c? No  Shower chair or bench in shower? Yes  Elevated toilet seat or a handicapped toilet? No   TIMED UP AND GO:  Was the test performed? No . Virtual visit.  Cognitive Function: MMSE - Mini Mental  State Exam 10/11/2017  Orientation to time 5  Orientation to Place 5  Registration 3  Attention/ Calculation 5  Recall 3  Language- name 2 objects 2  Language- repeat 1  Language- follow 3 step command 3  Language- read & follow direction 1  Write a sentence 1  Copy design 1  Total score 30     6CIT Screen 10/18/2019 10/17/2018  What Year? 0 points 0 points  What month? 0 points 0 points  What time? - 0 points  Count back from 20 - 0 points  Months in reverse - 0 points  Repeat phrase 0 points 0 points  Total Score - 0    Immunizations Immunization History  Administered Date(s) Administered  . Fluad Quad(high Dose 65+) 12/14/2018  . Influenza, High Dose Seasonal PF 01/24/2018  . Influenza-Unspecified 01/17/2012, 12/31/2013  . PFIZER SARS-COV-2 Vaccination 05/15/2019, 06/05/2019  . Pneumococcal Conjugate-13 10/30/2013  . Pneumococcal Polysaccharide-23 09/14/2012  . Tdap 11/07/2013  . Zoster 09/14/2009  . Zoster Recombinat (Shingrix) 12/13/2017, 02/20/2018    Health Maintenance There are no preventive care reminders to display for this patient. Health Maintenance  Topic Date Due  . DEXA SCAN  10/17/2020 (Originally 06/06/1994)  . INFLUENZA VACCINE  11/17/2019  . TETANUS/TDAP  11/08/2023  . COVID-19 Vaccine  Completed  . PNA vac Low Risk Adult  Completed   Dexa Scan deferred per patient preference.   Dental Screening: Recommended annual dental exams for proper oral hygiene  Community Resource Referral / Chronic Care Management: CRR required this visit?  No   CCM required this visit?  No      Plan:   Keep all routine maintenance appointments.   I have personally reviewed and noted the following in the patient's chart:   . Medical and social history . Use of alcohol, tobacco or illicit drugs  . Current medications and supplements . Functional ability and status . Nutritional status . Physical  activity . Advanced directives . List of other  physicians . Hospitalizations, surgeries, and ER visits in previous 12 months . Vitals . Screenings to include cognitive, depression, and falls . Referrals and appointments  In addition, I have reviewed and discussed with patient certain preventive protocols, quality metrics, and best practice recommendations. A written personalized care plan for preventive services as well as general preventive health recommendations were provided to patient via mychart.     OBrien-Blaney, Keldric Poyer L, LPN   5/0/7225     I have reviewed the above information and agree with above.   Deborra Medina, MD

## 2019-10-28 ENCOUNTER — Other Ambulatory Visit: Payer: Self-pay | Admitting: Internal Medicine

## 2019-10-31 DIAGNOSIS — D0472 Carcinoma in situ of skin of left lower limb, including hip: Secondary | ICD-10-CM | POA: Diagnosis not present

## 2019-12-13 DIAGNOSIS — M8949 Other hypertrophic osteoarthropathy, multiple sites: Secondary | ICD-10-CM | POA: Diagnosis not present

## 2019-12-13 DIAGNOSIS — M1A00X0 Idiopathic chronic gout, unspecified site, without tophus (tophi): Secondary | ICD-10-CM | POA: Diagnosis not present

## 2019-12-30 ENCOUNTER — Other Ambulatory Visit: Payer: Self-pay

## 2019-12-30 ENCOUNTER — Ambulatory Visit (INDEPENDENT_AMBULATORY_CARE_PROVIDER_SITE_OTHER): Payer: Medicare PPO

## 2019-12-30 DIAGNOSIS — Z23 Encounter for immunization: Secondary | ICD-10-CM

## 2019-12-30 MED ORDER — TELMISARTAN 80 MG PO TABS: 80.0000 mg | ORAL_TABLET | Freq: Every day | ORAL | 5 refills | Status: DC

## 2020-02-11 DIAGNOSIS — M19042 Primary osteoarthritis, left hand: Secondary | ICD-10-CM | POA: Diagnosis not present

## 2020-02-11 DIAGNOSIS — M1712 Unilateral primary osteoarthritis, left knee: Secondary | ICD-10-CM | POA: Diagnosis not present

## 2020-02-11 DIAGNOSIS — M19041 Primary osteoarthritis, right hand: Secondary | ICD-10-CM | POA: Diagnosis not present

## 2020-05-26 DIAGNOSIS — M1A00X0 Idiopathic chronic gout, unspecified site, without tophus (tophi): Secondary | ICD-10-CM | POA: Diagnosis not present

## 2020-05-26 DIAGNOSIS — M1712 Unilateral primary osteoarthritis, left knee: Secondary | ICD-10-CM | POA: Diagnosis not present

## 2020-05-26 DIAGNOSIS — M25552 Pain in left hip: Secondary | ICD-10-CM | POA: Diagnosis not present

## 2020-05-28 ENCOUNTER — Other Ambulatory Visit: Payer: Self-pay | Admitting: Internal Medicine

## 2020-05-28 DIAGNOSIS — M1612 Unilateral primary osteoarthritis, left hip: Secondary | ICD-10-CM | POA: Diagnosis not present

## 2020-05-28 DIAGNOSIS — M7062 Trochanteric bursitis, left hip: Secondary | ICD-10-CM | POA: Diagnosis not present

## 2020-05-28 DIAGNOSIS — M25552 Pain in left hip: Secondary | ICD-10-CM | POA: Diagnosis not present

## 2020-06-04 DIAGNOSIS — L821 Other seborrheic keratosis: Secondary | ICD-10-CM | POA: Diagnosis not present

## 2020-07-13 ENCOUNTER — Telehealth: Payer: Self-pay | Admitting: Internal Medicine

## 2020-07-13 NOTE — Telephone Encounter (Signed)
Received order from [place] for patient Breast Prothesis supplies placed in quick sign with most recent Office notes.

## 2020-07-14 ENCOUNTER — Telehealth: Payer: Self-pay | Admitting: Internal Medicine

## 2020-07-17 DIAGNOSIS — Z9013 Acquired absence of bilateral breasts and nipples: Secondary | ICD-10-CM | POA: Diagnosis not present

## 2020-07-17 DIAGNOSIS — C50112 Malignant neoplasm of central portion of left female breast: Secondary | ICD-10-CM | POA: Diagnosis not present

## 2020-07-17 DIAGNOSIS — C50111 Malignant neoplasm of central portion of right female breast: Secondary | ICD-10-CM | POA: Diagnosis not present

## 2020-07-20 ENCOUNTER — Telehealth: Payer: Self-pay | Admitting: Internal Medicine

## 2020-07-20 NOTE — Telephone Encounter (Signed)
Patient called in thinks she should be on another b/p medication wanted a call

## 2020-07-21 NOTE — Telephone Encounter (Signed)
Spoke with pt and she stated that she just wanted to have her blood pressure checked. Pt has not been seen in two years so she was scheduled for a follow up appt with Dr. Derrel Nip next week. Pt is aware of appt date and time.

## 2020-07-27 ENCOUNTER — Other Ambulatory Visit: Payer: Self-pay

## 2020-07-27 ENCOUNTER — Telehealth: Payer: Self-pay | Admitting: Internal Medicine

## 2020-07-27 ENCOUNTER — Ambulatory Visit: Payer: Medicare PPO | Admitting: Internal Medicine

## 2020-07-27 ENCOUNTER — Other Ambulatory Visit: Payer: Self-pay | Admitting: Internal Medicine

## 2020-07-27 ENCOUNTER — Encounter: Payer: Self-pay | Admitting: Internal Medicine

## 2020-07-27 VITALS — BP 142/52 | HR 82 | Temp 98.3°F | Ht 62.0 in | Wt 118.2 lb

## 2020-07-27 DIAGNOSIS — E559 Vitamin D deficiency, unspecified: Secondary | ICD-10-CM | POA: Diagnosis not present

## 2020-07-27 DIAGNOSIS — M109 Gout, unspecified: Secondary | ICD-10-CM

## 2020-07-27 DIAGNOSIS — I10 Essential (primary) hypertension: Secondary | ICD-10-CM

## 2020-07-27 DIAGNOSIS — R5383 Other fatigue: Secondary | ICD-10-CM

## 2020-07-27 LAB — COMPREHENSIVE METABOLIC PANEL
ALT: 13 U/L (ref 0–35)
AST: 15 U/L (ref 0–37)
Albumin: 3.8 g/dL (ref 3.5–5.2)
Alkaline Phosphatase: 71 U/L (ref 39–117)
BUN: 14 mg/dL (ref 6–23)
CO2: 29 mEq/L (ref 19–32)
Calcium: 10.3 mg/dL (ref 8.4–10.5)
Chloride: 100 mEq/L (ref 96–112)
Creatinine, Ser: 0.63 mg/dL (ref 0.40–1.20)
GFR: 77.71 mL/min (ref >60.00)
Glucose, Bld: 89 mg/dL (ref 70–99)
Potassium: 4.1 mEq/L (ref 3.5–5.1)
Sodium: 138 mEq/L (ref 135–145)
Total Bilirubin: 0.6 mg/dL (ref 0.2–1.2)
Total Protein: 6.1 g/dL (ref 6.0–8.3)

## 2020-07-27 LAB — CBC WITH DIFFERENTIAL/PLATELET
Basophils Absolute: 0 10*3/uL (ref 0.0–0.1)
Basophils Relative: 0.4 % (ref 0.0–3.0)
Eosinophils Absolute: 0.1 10*3/uL (ref 0.0–0.7)
Eosinophils Relative: 0.9 % (ref 0.0–5.0)
HCT: 37.6 % (ref 36.0–46.0)
Hemoglobin: 12.5 g/dL (ref 12.0–15.0)
Lymphocytes Relative: 16.5 % (ref 12.0–46.0)
Lymphs Abs: 1.6 10*3/uL (ref 0.7–4.0)
MCHC: 33.2 g/dL (ref 30.0–36.0)
MCV: 91 fl (ref 78.0–100.0)
Monocytes Absolute: 1 10*3/uL (ref 0.1–1.0)
Monocytes Relative: 10.9 % (ref 3.0–12.0)
Neutro Abs: 6.8 10*3/uL (ref 1.4–7.7)
Neutrophils Relative %: 71.3 % (ref 43.0–77.0)
Platelets: 200 10*3/uL (ref 150.0–400.0)
RBC: 4.13 Mil/uL (ref 3.87–5.11)
RDW: 14.6 % (ref 11.5–15.5)
WBC: 9.5 10*3/uL (ref 4.0–10.5)

## 2020-07-27 LAB — TSH: TSH: 3.16 u[IU]/mL (ref 0.35–4.50)

## 2020-07-27 LAB — VITAMIN D 25 HYDROXY (VIT D DEFICIENCY, FRACTURES): VITD: 38.88 ng/mL (ref 30.00–100.00)

## 2020-07-27 LAB — URIC ACID: Uric Acid, Serum: 2.8 mg/dL (ref 2.4–7.0)

## 2020-07-27 MED ORDER — AMLODIPINE BESYLATE 5 MG PO TABS: 5.0000 mg | ORAL_TABLET | Freq: Every day | ORAL | 1 refills | Status: DC

## 2020-07-27 MED ORDER — ERYTHROMYCIN 5 MG/GM OP OINT: 1.0000 "application " | TOPICAL_OINTMENT | Freq: Three times a day (TID) | OPHTHALMIC | 0 refills | Status: DC

## 2020-07-27 NOTE — Assessment & Plan Note (Signed)
Rechecking level today as she has not taken a supplelement other than the Calcium plus D

## 2020-07-27 NOTE — Telephone Encounter (Signed)
Patient was seen by Dr. Derrel Nip today and forgot to ask to have her amLODipine (NORVASC) 5 MG tablet refilled.

## 2020-07-27 NOTE — Assessment & Plan Note (Signed)
Home readings are at goal.  Continue amlodipine and telmisartan

## 2020-07-27 NOTE — Progress Notes (Signed)
Subjective:  Patient ID: Miranda Watkins, female    DOB: [birth year]  Age: 87 y.o. MRN: [id number]  CC: The primary encounter diagnosis was Primary hypertension. Diagnoses of Gouty arthritis, Vitamin D deficiency, Fatigue, unspecified type, and Hypovitaminosis D were also pertinent to this visit.  HPI DEAUNA Watkins presents for follow up on blood pressure issues. Patient was last seen in July 2020    This visit occurred during the SARS-CoV-2 public health emergency.  Safety protocols were in place, including screening questions prior to the visit, additional usage of staff PPE, and extensive cleaning of exam room while observing appropriate contact time as indicated for disinfecting solutions.   1) She has developed an upper eyelid infection that started yesterday .  Has not called [facility] yet and has not seen Miranda Watkins in a few years.  Has had a mild URI for several days,  Symptoms of rhinitis and congestion without fevers ,  Facial pain or cough. Received the COVID  booster this weekend WHICH did not aggravated her  symptoms .  No worse. No sneezing ,  Just congestion   2)  HTN:  She has been Taking amlodipine 5 mg and telmisartan 80 mg daily for the last 2 years.  Her  home readings have been  checked periodically and have been 120/80 or less.  She has no symptoms of orthostasis    3) She has an unintentional 6 lb weight loss but appetite is good. Lives independently in a 2 story home on [address].  Eats out most evenings with friends at the [employer] other fine dining establishments.  Rare fast food.  Averages  3 meals daily .  Has had no trouble moving bowels, no incontinence. Continues to drive  With no MVAs or  falls.   Golfs twice weekly, plays bridge weekly.   40 Gout:  Taking allopurinol daily.  No recent gout flares    Outpatient Medications Prior to Visit  Medication Sig Dispense Refill  . allopurinol (ZYLOPRIM) 300 MG tablet TAKE ONE TABLET EVERY DAY 90 tablet 1  .  amLODipine (NORVASC) 5 MG tablet Take 1 tablet (5 mg total) by mouth daily. 90 tablet 1  . Calcium Carbonate-Vit D-Min (CALCIUM 1200 PO) Take 1 capsule by mouth daily.    . Cranberry 1000 MG CAPS Take 1 capsule by mouth daily.    . Inulin (FIBER CHOICE PO) Take 1 tablet by mouth daily.    Marland Kitchen telmisartan (MICARDIS) 80 MG tablet TAKE 1 TABLET BY MOUTH NIGHTLY 30 tablet 5   No facility-administered medications prior to visit.    Review of Systems;  Patient denies headache, fevers, malaise, unintentional weight loss, skin rash, eye pain, sinus congestion and sinus pain, sore throat, dysphagia,  hemoptysis , cough, dyspnea, wheezing, chest pain, palpitations, orthopnea, edema, abdominal pain, nausea, melena, diarrhea, constipation, flank pain, dysuria, hematuria, urinary  Frequency, nocturia, numbness, tingling, seizures,  Focal weakness, Loss of consciousness,  Tremor, insomnia, depression, anxiety, and suicidal ideation.      Objective:  BP (!) 142/52 (BP Location: Right Arm, Patient Position: Sitting, Cuff Size: Normal)   Pulse 82   Temp 98.3 F (36.8 C) (Oral)   Ht 5\' 2"  (1.575 m)   Wt 118 lb 3.2 oz (53.6 kg)   SpO2 96%   BMI 21.62 kg/m   BP Readings from Last 3 Encounters:  07/27/20 (!) 142/52  10/11/17 106/64  11/30/16 140/80    Wt Readings from Last 3 Encounters:  07/27/20  118 lb 3.2 oz (53.6 kg)  10/18/19 124 lb (56.2 kg)  10/11/17 124 lb 12.8 oz (56.6 kg)    General appearance: alert, cooperative and appears stated age Ears: normal TM's and external ear canals both ears Eyes:  Left upper eyelid erythematous,  No conjunctivitis Throat: lips, mucosa, and tongue normal; teeth and gums normal Neck: no adenopathy, no carotid bruit, supple, symmetrical, trachea midline and thyroid not enlarged, symmetric, no tenderness/mass/nodules Back: symmetric, no curvature. ROM normal. No CVA tenderness. Lungs: clear to auscultation bilaterally Heart: regular rate and rhythm, S1, S2  normal, no murmur, click, rub or gallop Abdomen: soft, non-tender; bowel sounds normal; no masses,  no organomegaly Pulses: 2+ and symmetric Skin: Skin color, texture, turgor normal. No rashes or lesions Lymph nodes: Cervical, supraclavicular, and axillary nodes normal.  No results found for: HGBA1C  Lab Results  Component Value Date   CREATININE 0.69 10/31/2018   CREATININE 0.67 11/30/2016   CREATININE 0.70 11/09/2015    Lab Results  Component Value Date   WBC 9.9 10/31/2018   HGB 13.9 10/31/2018   HCT 42.1 10/31/2018   PLT 266.0 10/31/2018   GLUCOSE 99 10/31/2018   CHOL 134 11/30/2016   TRIG 173.0 (H) 11/30/2016   HDL 58.50 11/30/2016   LDLCALC 41 11/30/2016   ALT 13 10/31/2018   AST 17 10/31/2018   NA 140 10/31/2018   K 4.0 10/31/2018   CL 103 10/31/2018   CREATININE 0.69 10/31/2018   BUN 11 10/31/2018   CO2 26 10/31/2018   TSH 3.08 10/31/2018    DG Knee Complete 4 Views Left  Result Date: 10/06/2014 CLINICAL DATA:  Left knee pain, redness, and swelling medially. Difficulty with weight-bearing. No known recent injury. EXAM: LEFT KNEE - COMPLETE 4+ VIEW COMPARISON:  None. FINDINGS: No acute fracture, dislocation, or knee joint effusion is identified. There is mild medial and patellofemoral compartment joint space narrowing. No lytic or blastic osseous lesion is seen. Vascular calcification is noted. IMPRESSION: No acute osseous abnormality.  Mild osteoarthrosis. Electronically Signed   By: Logan Bores   On: 10/06/2014 13:04    Assessment & Plan:   Problem List Items Addressed This Visit      Unprioritized   Gouty arthritis    Continue allopurinol for prevention of gout.  Uric acid level and GFR needed.          Relevant Orders   Uric acid   Hypertension - Primary    Home readings are at goal.  Continue amlodipine and telmisartan      Relevant Orders   Comprehensive metabolic panel   Hypovitaminosis D    Rechecking level today as she has not taken a  supplelement other than the Calcium plus D       Other Visit Diagnoses    Vitamin D deficiency       Relevant Orders   VITAMIN D 25 Hydroxy (Vit-D Deficiency, Fractures)   Fatigue, unspecified type       Relevant Orders   TSH   CBC with Differential/Platelet      I provided  30 minutes of  face-to-face time during this encounter reviewing patient's current problems and past surgeries, labs and imaging studies, providing counseling on the above mentioned problems , and coordination  of care . I am having Shalie Schremp. Lizak start on erythromycin. I am also having her maintain her Calcium Carbonate-Vit D-Min (CALCIUM 1200 PO), Inulin (FIBER CHOICE PO), Cranberry, amLODipine, allopurinol, and telmisartan.  Meds ordered this encounter  Medications  . erythromycin ophthalmic ointment    Sig: Place 1 application into the left eye 3 (three) times daily.    Dispense:  3.5 g    Refill:  0    There are no discontinued medications.  Follow-up: No follow-ups on file.   Crecencio Mc, MD

## 2020-07-27 NOTE — Patient Instructions (Addendum)
Good to see you!   I have called in an antibiotic ointment for your eyelid infection.  Use it 2-3 times daily. Your eye makeup is contaminated.  Throw it out   Your blood pressure is fine for your age.  Continue your current medications for now  The allopurinol PREVENTS gout.  Do not stop taking it

## 2020-07-27 NOTE — Assessment & Plan Note (Signed)
Continue allopurinol for prevention of gout.  Uric acid level and GFR needed.

## 2020-07-29 ENCOUNTER — Other Ambulatory Visit: Payer: Self-pay | Admitting: Internal Medicine

## 2020-07-29 MED ORDER — TELMISARTAN 80 MG PO TABS: 80.0000 mg | ORAL_TABLET | Freq: Every day | ORAL | 1 refills | Status: DC

## 2020-08-19 ENCOUNTER — Ambulatory Visit: Payer: Medicare PPO | Admitting: Internal Medicine

## 2020-08-27 ENCOUNTER — Other Ambulatory Visit: Payer: Self-pay

## 2020-08-27 ENCOUNTER — Emergency Department: Payer: Medicare PPO

## 2020-08-27 ENCOUNTER — Telehealth: Payer: Self-pay | Admitting: Internal Medicine

## 2020-08-27 ENCOUNTER — Emergency Department
Admission: EM | Admit: 2020-08-27 | Discharge: 2020-08-27 | Disposition: A | Discharge status: Home / Self Care | Payer: Medicare PPO | Attending: Student in an Organized Health Care Education/Training Program | Admitting: Student in an Organized Health Care Education/Training Program

## 2020-08-27 DIAGNOSIS — M79605 Pain in left leg: Secondary | ICD-10-CM | POA: Diagnosis not present

## 2020-08-27 DIAGNOSIS — Z79899 Other long term (current) drug therapy: Secondary | ICD-10-CM | POA: Diagnosis not present

## 2020-08-27 DIAGNOSIS — I10 Essential (primary) hypertension: Secondary | ICD-10-CM | POA: Diagnosis not present

## 2020-08-27 DIAGNOSIS — Z87891 Personal history of nicotine dependence: Secondary | ICD-10-CM | POA: Insufficient documentation

## 2020-08-27 DIAGNOSIS — M7122 Synovial cyst of popliteal space [Baker], left knee: Secondary | ICD-10-CM | POA: Insufficient documentation

## 2020-08-27 DIAGNOSIS — Z853 Personal history of malignant neoplasm of breast: Secondary | ICD-10-CM | POA: Insufficient documentation

## 2020-08-27 NOTE — Discharge Instructions (Signed)
Primary care provider if any continued problems or concerns.  The name of the orthopedist is listed on your discharge papers should you decide to have anything done with your Baker's cyst.

## 2020-08-27 NOTE — Telephone Encounter (Signed)
Spoke with PCP concerning call and agreed ER disposition called patient and patient is calling daughter to take her to the ER. To R/O DVT of left leg.

## 2020-08-27 NOTE — Telephone Encounter (Signed)
PT is calling in wanting to request a callback in regards to her symptoms from either [place] or [place]. She states she talk to a nurse earlier today

## 2020-08-27 NOTE — ED Notes (Signed)
Pt taken for U/S

## 2020-08-27 NOTE — ED Triage Notes (Signed)
Pt from home, pt states she has reddened areas on both legs that are slightly painful that started a few weeks ago. Pt's primary care MD sent pt to ED for possible blood clots. Pt with reddened area on back of left ankle, and inner right calf. Pt states she notices them being uncomfortable.No edema noted.

## 2020-08-27 NOTE — Telephone Encounter (Signed)
Patient said she has swelling in her left leg with red spots the size of a nickel to quarter size , and is painful and aching could not put a numeric number on it.  Now thinks there is a spot coming up on the right foot.  Aching but worsen with movement,had patient perform Homans' maneuver positive.   no in office appointment  Available and patient cannot do virtual.

## 2020-08-27 NOTE — ED Provider Notes (Signed)
[facility] Emergency Department Provider Note  ____________________________________________   Event Date/Time   First MD Initiated Contact with Patient 08/27/20 1212     (approximate)  I have reviewed the triage vital signs and the nursing notes.   HISTORY  Chief Complaint Leg Pain   HPI MARCIEL Watkins is a 87 y.o. female was sent to the ED per her primary care provider to rule out possible blood clot to her left leg.  Patient states that she had a reddened area to the back of her left ankle and inner calf that concerned her.  Patient denies any swelling or increased pain with walking.  Patient denies any injury to her lower extremity or prolonged traveling.  There is no prior history of DVTs.  Patient reports that the left leg is primarily what concerns her.  She rates her pain as a 4/10.      Past Medical History:  Diagnosis Date  . Breast cancer ([place]) 08/2013   Left, 1.8cm , N0,M0/ ER/PR pos Her2 neg/ INVASIVE MAMMARY (DUCTAL) CARCINOMA/Bilateral  . Breast cancer ([place]) 1990   right breast  . Diverticulosis of colon   . Heart murmur 07/02/2003  . Hypertension     Patient Active Problem List   Diagnosis Date Noted  . Gouty arthritis 10/17/2018  . Heart murmur 11/09/2015  . Left knee DJD 10/23/2014  . Hypovitaminosis D 11/01/2013  . 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 08/28/2013  .  bilateral BRCA  right in 1991, left 2000, bilateral lumpectomy, right AD, bil radiation 08/29/2012  . Hypertension 08/15/2011  . Carotid stenosis 08/15/2011  . Encounter for Medicare annual wellness exam 08/15/2011  . Diverticulosis of colon     Past Surgical History:  Procedure Laterality Date  . BASAL CELL CARCINOMA EXCISION  2014   face  . BREAST SURGERY     bilateral 1991, and 2000  . BREAST SURGERY Bilateral 09/06/13   bilateral mastectomy/tumor size is 1.8cm, SN neg. This is  T1c,N0, M0.  Marland Kitchen CATARACT EXTRACTION, BILATERAL     Tobe Sos,  remote,   . CHOLECYSTECTOMY  1996  . COLONOSCOPY  2009   Dr. Vira Agar  . MASTECTOMY, RADICAL Bilateral May 2015  . TONSILLECTOMY  1998    Prior to Admission medications   Medication Sig Start Date End Date Taking? Authorizing Provider  allopurinol (ZYLOPRIM) 300 MG tablet TAKE ONE TABLET EVERY DAY 07/27/20   Crecencio Mc, MD  amLODipine (NORVASC) 5 MG tablet Take 1 tablet (5 mg total) by mouth daily. 07/27/20   Crecencio Mc, MD  Calcium Carbonate-Vit D-Min (CALCIUM 1200 PO) Take 1 capsule by mouth daily.    [provider]  Cranberry 1000 MG CAPS Take 1 capsule by mouth daily.    [provider]  erythromycin ophthalmic ointment Place 1 application into the left eye 3 (three) times daily. 07/27/20   Crecencio Mc, MD  Inulin (FIBER CHOICE PO) Take 1 tablet by mouth daily.    [provider]  telmisartan (MICARDIS) 80 MG tablet Take 1 tablet (80 mg total) by mouth at bedtime. 07/29/20   Crecencio Mc, MD    Allergies Arimidex [anastrozole]  Family History  Problem Relation Age of Onset  . Hypertension Mother   . Cancer Father        lung cancer, smoker  . Hypertension Sister     Social History Social History   Tobacco  Use  . Smoking status: Former Smoker    Years: 40.00    Types: Cigarettes    Quit date: 08/15/1983    Years since quitting: 37.0  . Smokeless tobacco: Never Used  Vaping Use  . Vaping Use: Never used  Substance Use Topics  . Alcohol use: Yes    Alcohol/week: 7.0 standard drinks    Types: 7 Glasses of wine per week  . Drug use: No    Review of Systems Constitutional: No fever/chills Cardiovascular: Denies chest pain. Respiratory: Denies shortness of breath. Gastrointestinal: No abdominal pain.  No nausea, no vomiting. Genitourinary: Negative for dysuria. Musculoskeletal: Positive for left leg pain. Skin: Positive for erythema left leg. Neurological: Negative  for headaches, focal weakness or numbness. ____________________________________________   PHYSICAL EXAM:  VITAL SIGNS: ED Triage Vitals  Enc Vitals Group     BP 08/27/20 1132 (!) 140/98     Pulse Rate 08/27/20 1132 89     Resp 08/27/20 1132 20     Temp 08/27/20 1132 97.8 F (36.6 C)     Temp Source 08/27/20 1132 Oral     SpO2 08/27/20 1132 95 %     Weight 08/27/20 1133 125 lb (56.7 kg)     Height 08/27/20 1133 $RemoveBefor'5\' 3"'[id number]$  (1.6 m)     Head Circumference --      Peak Flow --      Pain Score 08/27/20 1133 4     Pain Loc --      Pain Edu? --      Excl. in [place]? --     Constitutional: Alert and oriented. Well appearing and in no acute distress.  Patient is extremely hard of hearing. Eyes: Conjunctivae are normal.  Head: Atraumatic. Neck: No stridor.   Cardiovascular: Normal rate, regular rhythm. Grossly normal heart sounds.  Good peripheral circulation. Respiratory: Normal respiratory effort.  No retractions. Lungs CTAB. Gastrointestinal: Soft and nontender. No distention.  Musculoskeletal: Examination of the lower extremities there is some soft tissue edema noted to the left lower extremity in comparison with the right.  There is several ecchymotic areas without tenderness.  Bevelyn Buckles' sign is negative.  Pulses present.  No skin abrasions or lesions are noted.  Motor sensory function intact.  Capillary refills less than 3 seconds.  No warmth is appreciated on palpation.  There is some tenderness on palpation of the posterior proximal calf and posterior knee.  Range of motion is minimally restricted secondary to discomfort. Neurologic:  Normal speech and language. No gross focal neurologic deficits are appreciated.  Skin:  Skin is warm, dry and intact. No rash noted. Psychiatric: Mood and affect are normal. Speech and behavior are normal.  ____________________________________________   LABS (all labs ordered are listed, but only abnormal results are displayed)  Labs Reviewed - No data to  display ____________________________________________   RADIOLOGY I, Johnn Hai, personally viewed and evaluated these images (plain radiographs) as part of my medical decision making, as well as reviewing the written report by the radiologist.   Official radiology report(s): US Venous Img Lower Unilateral Left  Result Date: 08/27/2020 CLINICAL DATA:  Left lower extremity pain for the past 3 weeks. History of breast cancer. Evaluate for DVT. EXAM: LEFT LOWER EXTREMITY VENOUS DOPPLER ULTRASOUND TECHNIQUE: Gray-scale sonography with graded compression, as well as color Doppler and duplex ultrasound were performed to evaluate the lower extremity deep venous systems from the level of the common femoral vein and including the common femoral, femoral, profunda femoral, popliteal and  calf veins including the posterior tibial, peroneal and gastrocnemius veins when visible. The superficial great saphenous vein was also interrogated. Spectral Doppler was utilized to evaluate flow at rest and with distal augmentation maneuvers in the common femoral, femoral and popliteal veins. COMPARISON:  None. FINDINGS: Contralateral Common Femoral Vein: Respiratory phasicity is normal and symmetric with the symptomatic side. No evidence of thrombus. Normal compressibility. Common Femoral Vein: No evidence of thrombus. Normal compressibility, respiratory phasicity and response to augmentation. Saphenofemoral Junction: No evidence of thrombus. Normal compressibility and flow on color Doppler imaging. Profunda Femoral Vein: No evidence of thrombus. Normal compressibility and flow on color Doppler imaging. Femoral Vein: No evidence of thrombus. Normal compressibility, respiratory phasicity and response to augmentation. Popliteal Vein: No evidence of thrombus. Normal compressibility, respiratory phasicity and response to augmentation. Calf Veins: No evidence of thrombus. Normal compressibility and flow on color Doppler imaging.  Superficial Great Saphenous Vein: No evidence of thrombus. Normal compressibility. Venous Reflux:  None. Other Findings: Note is made of an approximately 4.0 x 1.4 x 2.1 cm fluid collection with left popliteal fossa compatible with a Baker's cyst. There is no sonographic correlate for patient's area discomfort involving the lateral aspect of the ankle (images [id number]). IMPRESSION: 1. No evidence of DVT within the left lower extremity. 2. Incidental note made of an approximately 4.0 cm left-sided Baker's cyst. 3. No sonographic correlate for patient's area of discomfort involving the lateral aspect of the ankle. Electronically Signed   By: Sandi Mariscal M.D.   On: 08/27/2020 14:46    ____________________________________________   PROCEDURES  Procedure(s) performed (including Critical Care):  Procedures   ____________________________________________   INITIAL IMPRESSION / ASSESSMENT AND PLAN / ED COURSE  As part of my medical decision making, I reviewed the following data within the electronic MEDICAL RECORD NUMBER Notes from prior ED visits and [place] Controlled Substance Database  87 year old female is brought to the ED to rule out a DVT in her left lower extremity.  There is been no recent injury or prolonged traveling.  Patient denies any previous DVTs.  On physical exam there is some minimal edema present and tenderness is noted on palpation of posterior knee.  Ultrasound was negative for DVT but did show a Baker's cyst.  We discussed this with both patient and family.  They will consider following up with an orthopedist and the orthopedist on-call was Dr. Sharlet Salina.  Contact information and address was given to the patient and family.  Patient was reassured and will follow up with her PCP if any continued problems   ____________________________________________   FINAL CLINICAL IMPRESSION(S) / ED DIAGNOSES  Final diagnoses:  Baker's cyst of knee, left  Left leg pain     ED Discharge Orders     None      *Please note:  CHELCIE ESTORGA was evaluated in Emergency Department on 08/27/2020 for the symptoms described in the history of present illness. She was evaluated in the context of the global COVID-19 pandemic, which necessitated consideration that the patient might be at risk for infection with the SARS-CoV-2 virus that causes COVID-19. Institutional protocols and algorithms that pertain to the evaluation of patients at risk for COVID-19 are in a state of rapid change based on information released by regulatory bodies including the CDC and federal and state organizations. These policies and algorithms were followed during the patient's care in the ED.  Some ED evaluations and interventions may be delayed as a result of limited staffing during and the  pandemic.*   Note:  This document was prepared using Dragon voice recognition software and may include unintentional dictation errors.    Johnn Hai, PA-C 08/27/20 1638    Carrie Mew, MD 08/27/20 Joen Laura

## 2020-09-07 DIAGNOSIS — L853 Xerosis cutis: Secondary | ICD-10-CM | POA: Diagnosis not present

## 2020-09-07 DIAGNOSIS — Z86007 Personal history of in-situ neoplasm of skin: Secondary | ICD-10-CM | POA: Diagnosis not present

## 2020-09-07 DIAGNOSIS — Z08 Encounter for follow-up examination after completed treatment for malignant neoplasm: Secondary | ICD-10-CM | POA: Diagnosis not present

## 2020-09-28 DIAGNOSIS — M1A00X0 Idiopathic chronic gout, unspecified site, without tophus (tophi): Secondary | ICD-10-CM | POA: Diagnosis not present

## 2020-09-28 DIAGNOSIS — Z79899 Other long term (current) drug therapy: Secondary | ICD-10-CM | POA: Diagnosis not present

## 2020-09-28 DIAGNOSIS — M1712 Unilateral primary osteoarthritis, left knee: Secondary | ICD-10-CM | POA: Diagnosis not present

## 2020-10-12 DIAGNOSIS — L570 Actinic keratosis: Secondary | ICD-10-CM | POA: Diagnosis not present

## 2020-10-12 DIAGNOSIS — X32XXXA Exposure to sunlight, initial encounter: Secondary | ICD-10-CM | POA: Diagnosis not present

## 2020-10-20 ENCOUNTER — Ambulatory Visit: Payer: Medicare PPO

## 2020-11-03 ENCOUNTER — Telehealth: Payer: Self-pay | Admitting: Internal Medicine

## 2020-11-03 ENCOUNTER — Other Ambulatory Visit: Payer: Self-pay | Admitting: Internal Medicine

## 2020-11-03 NOTE — Telephone Encounter (Signed)
Pt has been scheduled for a BP check on 11/04/20 at 10:30am. Pt verbalized understanding to stopping amlodipine and had no other questions.

## 2020-11-03 NOTE — Telephone Encounter (Signed)
Patient wants to know if she should continue to take amLODipine (NORVASC) 5 MG tablet and telmisartan (MICARDIS) 80 MG tablet. Patient thinks her blood pressure is running low.Patient made appointment for the next availability in August.

## 2020-11-04 ENCOUNTER — Ambulatory Visit (INDEPENDENT_AMBULATORY_CARE_PROVIDER_SITE_OTHER): Payer: Medicare PPO

## 2020-11-04 ENCOUNTER — Other Ambulatory Visit: Payer: Self-pay

## 2020-11-04 VITALS — BP 150/68 | HR 60 | Temp 96.5°F | Resp 20 | Wt 118.6 lb

## 2020-11-04 DIAGNOSIS — Z0130 Encounter for examination of blood pressure without abnormal findings: Secondary | ICD-10-CM

## 2020-11-04 DIAGNOSIS — I771 Stricture of artery: Secondary | ICD-10-CM

## 2020-11-04 NOTE — Progress Notes (Signed)
Patient here for nurse visit BP check per order from Dr. Derrel Nip on 11/03/2020.   Patient reports compliance with prescribed BP medications: yes  BP Medication the patient is taking: Amlodipine, Telmisartan  BP medication the patient is not taking:  Last dose of BP medication: Pt last took BP medication on 11/03/20  BP Readings from Last 3 Encounters:  11/04/20 (!) 150/68  08/27/20 (!) 152/51  07/27/20 (!) 142/52   Pulse Readings from Last 3 Encounters:  11/04/20 60  08/27/20 73  07/27/20 82   Any Headaches? Dizziness? Light Headedness? Pt reports no symptoms.   Pt came in stating that her blood pressure was too low. Took blood pressure on left arm manual and I could not hear the blood pressure. Took the blood pressure with the automated BP cuff and it was 87/56. Had patient wait 10 minutes and came back and checked the right arm and it was 140/62 per [place], CMA. Rechecked right arm again and it was 150/68. Patient confirmed that she only takes her blood pressure on the Left arm and stated that she will go and buy a new BP cuff today as well. Spoke with Dr. Derrel Nip and patient was instructed to continue taking her BP medication as it still reads high on the right arm. Pt was instructed to take her blood pressure on her right arm only from now on and that she will be referred to Vascular for a possible blockage.   [place], CMA

## 2020-11-05 ENCOUNTER — Encounter: Payer: Self-pay | Admitting: Internal Medicine

## 2020-11-05 DIAGNOSIS — I771 Stricture of artery: Secondary | ICD-10-CM | POA: Insufficient documentation

## 2020-11-05 NOTE — Addendum Note (Signed)
Addended by: Crecencio Mc on: 11/05/2020 05:10 PM   Modules accepted: Orders

## 2020-11-26 DIAGNOSIS — L570 Actinic keratosis: Secondary | ICD-10-CM | POA: Diagnosis not present

## 2020-11-26 DIAGNOSIS — L821 Other seborrheic keratosis: Secondary | ICD-10-CM | POA: Diagnosis not present

## 2020-11-26 DIAGNOSIS — X32XXXA Exposure to sunlight, initial encounter: Secondary | ICD-10-CM | POA: Diagnosis not present

## 2020-11-27 ENCOUNTER — Ambulatory Visit: Payer: Medicare PPO | Admitting: Internal Medicine

## 2020-11-27 ENCOUNTER — Encounter: Payer: Self-pay | Admitting: Internal Medicine

## 2020-11-27 ENCOUNTER — Other Ambulatory Visit: Payer: Self-pay

## 2020-11-27 VITALS — BP 148/66 | HR 82 | Temp 96.0°F | Resp 15 | Ht 63.0 in | Wt 116.6 lb

## 2020-11-27 DIAGNOSIS — I10 Essential (primary) hypertension: Secondary | ICD-10-CM | POA: Diagnosis not present

## 2020-11-27 DIAGNOSIS — I771 Stricture of artery: Secondary | ICD-10-CM | POA: Diagnosis not present

## 2020-11-27 DIAGNOSIS — Z23 Encounter for immunization: Secondary | ICD-10-CM

## 2020-11-27 DIAGNOSIS — Z711 Person with feared health complaint in whom no diagnosis is made: Secondary | ICD-10-CM | POA: Diagnosis not present

## 2020-11-27 NOTE — Patient Instructions (Signed)
Your blood pressure is fine.  We are not changing your medication.  Continue checking in the right arm.   Goal is 130 to 145 / 80 to 90   Your left arm has a lower pressure because of the radiation treatment you had for breast cancer causing a stricture on the blood vessel  that feeds the left arm    Keep the MOST  form (Pink form) with your living will  You received your final pneumonia vaccine

## 2020-11-27 NOTE — Progress Notes (Signed)
Subjective:  Patient ID: Miranda Watkins, female    DOB: Jan 17, 1928  Age: 87 y.o. MRN: [id number]  CC: The primary encounter diagnosis was Need for 23-polyvalent pneumococcal polysaccharide vaccine. Diagnoses of Primary hypertension, Subclavian artery stenosis, left ([place]), and Concern about end of life were also pertinent to this visit.  HPI Miranda Watkins presents for follow up on hypertension  This visit occurred during the SARS-CoV-2 public health emergency.  Safety protocols were in place, including screening questions prior to the visit, additional usage of staff PPE, and extensive cleaning of exam room while observing appropriate contact time as indicated for disinfecting solutions.   Patient has a history of bilateral breast cancer that was treated with XRT to chest wall  (remotely ) and has recently been noted to have a significant differential in brachial blood pressure readings that is constant. Left side reads 20 pts or more lower than right.  She has been referred to Dr Delana Meyer for presumed subclavian steal but has not been scheduled yet because they have not been able to contact her     She feels generally well .  Golfs twice weekly with Miranda Watkins and 3 other women her age.  .  Rides  the cart and does not play if temp is over 90. Sleeping well,  denies anxiety and depression.  Eats out or gets take out at the country club in [place] .  No falls.  She has a living will. She is adamant about no further workup or surgeries, even the vascular workup. ("I'm 87 years old and I have a bad heart.")  MOST Form discussed with patient today.       Outpatient Medications Prior to Visit  Medication Sig Dispense Refill   allopurinol (ZYLOPRIM) 300 MG tablet TAKE ONE TABLET EVERY DAY 90 tablet 1   amLODipine (NORVASC) 5 MG tablet TAKE 1 TABLET BY MOUTH DAILY. 90 tablet 1   Calcium Carbonate-Vit D-Min (CALCIUM 1200 PO) Take 1 capsule by mouth daily.     cholecalciferol (VITAMIN  D3) 25 MCG (1000 UNIT) tablet Take 1,000 Units by mouth daily.     Cranberry 1000 MG CAPS Take 1 capsule by mouth daily.     Inulin (FIBER CHOICE PO) Take 1 tablet by mouth daily.     telmisartan (MICARDIS) 80 MG tablet Take 1 tablet (80 mg total) by mouth at bedtime. 90 tablet 1   erythromycin ophthalmic ointment Place 1 application into the left eye 3 (three) times daily. (Patient not taking: Reported on 11/27/2020) 3.5 g 0   No facility-administered medications prior to visit.    Review of Systems;  Patient denies headache, fevers, malaise, unintentional weight loss, skin rash, eye pain, sinus congestion and sinus pain, sore throat, dysphagia,  hemoptysis , cough, dyspnea, wheezing, chest pain, palpitations, orthopnea, edema, abdominal pain, nausea, melena, diarrhea, constipation, flank pain, dysuria, hematuria, urinary  Frequency, nocturia, numbness, tingling, seizures,  Focal weakness, Loss of consciousness,  Tremor, insomnia, depression, anxiety, and suicidal ideation.      Objective:  BP (!) 148/66 (BP Location: Right Arm, Patient Position: Sitting, Cuff Size: Normal)   Pulse 82   Temp (!) 96 F (35.6 C) (Temporal)   Resp 15   Ht '5\' 3"'$  (1.6 m)   Wt 116 lb 9.6 oz (52.9 kg)   SpO2 97%   BMI 20.65 kg/m   BP Readings from Last 3 Encounters:  11/27/20 (!) 148/66  11/04/20 (!) 150/68  08/27/20 (!) 152/51  Wt Readings from Last 3 Encounters:  11/27/20 116 lb 9.6 oz (52.9 kg)  11/04/20 118 lb 9.6 oz (53.8 kg)  08/27/20 125 lb (56.7 kg)    General appearance: alert, cooperative and appears stated age Ears: normal TM's and external ear canals both ears Throat: lips, mucosa, and tongue normal; teeth and gums normal Neck: no adenopathy, no carotid bruit, supple, symmetrical, trachea midline and thyroid not enlarged, symmetric, no tenderness/mass/nodules Back: symmetric, no curvature. ROM normal. No CVA tenderness. Lungs: clear to auscultation bilaterally Heart: regular rate  and rhythm, S1, S2 normal, no murmur, click, rub or gallop Abdomen: soft, non-tender; bowel sounds normal; no masses,  no organomegaly Pulses: 2+ and symmetric Skin: Skin color, texture, turgor normal. No rashes or lesions Lymph nodes: Cervical, supraclavicular, and axillary nodes normal.  No results found for: HGBA1C  Lab Results  Component Value Date   CREATININE 0.63 07/27/2020   CREATININE 0.69 10/31/2018   CREATININE 0.67 11/30/2016    Lab Results  Component Value Date   WBC 9.5 07/27/2020   HGB 12.5 07/27/2020   HCT 37.6 07/27/2020   PLT 200.0 07/27/2020   GLUCOSE 89 07/27/2020   CHOL 134 11/30/2016   TRIG 173.0 (H) 11/30/2016   HDL 58.50 11/30/2016   LDLCALC 41 11/30/2016   ALT 13 07/27/2020   AST 15 07/27/2020   NA 138 07/27/2020   K 4.1 07/27/2020   CL 100 07/27/2020   CREATININE 0.63 07/27/2020   BUN 14 07/27/2020   CO2 29 07/27/2020   TSH 3.16 07/27/2020    US Venous Img Lower Unilateral Left  Result Date: 08/27/2020 CLINICAL DATA:  Left lower extremity pain for the past 3 weeks. History of breast cancer. Evaluate for DVT. EXAM: LEFT LOWER EXTREMITY VENOUS DOPPLER ULTRASOUND TECHNIQUE: Gray-scale sonography with graded compression, as well as color Doppler and duplex ultrasound were performed to evaluate the lower extremity deep venous systems from the level of the common femoral vein and including the common femoral, femoral, profunda femoral, popliteal and calf veins including the posterior tibial, peroneal and gastrocnemius veins when visible. The superficial great saphenous vein was also interrogated. Spectral Doppler was utilized to evaluate flow at rest and with distal augmentation maneuvers in the common femoral, femoral and popliteal veins. COMPARISON:  None. FINDINGS: Contralateral Common Femoral Vein: Respiratory phasicity is normal and symmetric with the symptomatic side. No evidence of thrombus. Normal compressibility. Common Femoral Vein: No evidence of  thrombus. Normal compressibility, respiratory phasicity and response to augmentation. Saphenofemoral Junction: No evidence of thrombus. Normal compressibility and flow on color Doppler imaging. Profunda Femoral Vein: No evidence of thrombus. Normal compressibility and flow on color Doppler imaging. Femoral Vein: No evidence of thrombus. Normal compressibility, respiratory phasicity and response to augmentation. Popliteal Vein: No evidence of thrombus. Normal compressibility, respiratory phasicity and response to augmentation. Calf Veins: No evidence of thrombus. Normal compressibility and flow on color Doppler imaging. Superficial Great Saphenous Vein: No evidence of thrombus. Normal compressibility. Venous Reflux:  None. Other Findings: Note is made of an approximately 4.0 x 1.4 x 2.1 cm fluid collection with left popliteal fossa compatible with a Baker's cyst. There is no sonographic correlate for patient's area discomfort involving the lateral aspect of the ankle (images [id number]). IMPRESSION: 1. No evidence of DVT within the left lower extremity. 2. Incidental note made of an approximately 4.0 cm left-sided Baker's cyst. 3. No sonographic correlate for patient's area of discomfort involving the lateral aspect of the ankle. Electronically Signed   By:  Sandi Mariscal M.D.   On: 08/27/2020 14:46    Assessment & Plan:   Problem List Items Addressed This Visit       Unprioritized   Hypertension    Given her probable subclavian stenosis secondary to XRT,  We will allow BP to be as high as 150 (in right arm) to avoid hypoperfusion of left arm, as readings on the left are 80/60.  Continue telmisartan and amlodipine .        Subclavian artery stenosis, left ([facility])    No further workup per discussion with patient today.  Vascular appt to be cancelled.  Daughter Miranda Watkins made aware of mother's wishes/       Concern about end of life    Patient has a living will .  MOST form discussed and signed.  She desires CPR  but reiterates that no prolonged mechanical ventilatory support       Other Visit Diagnoses     Need for 23-polyvalent pneumococcal polysaccharide vaccine    -  Primary   Relevant Orders   Pneumococcal polysaccharide vaccine 23-valent greater than or equal to 2yo subcutaneous/IM (Completed)     A total of 40 minutes of face to face time was spent with patient more than half of which was spent in counselling about her blood pressure,  subclavian stenosis (presumed) , her desire for no further workup,  contacting [facility] and patient's daughter Miranda Watkins by phone,   and end of life issues.MOST form discussed and signed.     No orders of the defined types were placed in this encounter.   Medications Discontinued During This Encounter  Medication Reason   erythromycin ophthalmic ointment     Follow-up: No follow-ups on file.   Crecencio Mc, MD

## 2020-11-27 NOTE — Assessment & Plan Note (Signed)
Patient has a living will .  MOST form discussed and signed.  She desires CPR but reiterates that no prolonged mechanical ventilatory support

## 2020-11-27 NOTE — Assessment & Plan Note (Addendum)
No further workup per discussion with patient today.  Vascular appt to be cancelled.  Daughter Mardene Celeste made aware of mother's wishes/

## 2020-11-27 NOTE — Assessment & Plan Note (Signed)
Given her probable subclavian stenosis secondary to XRT,  We will allow BP to be as high as 150 (in right arm) to avoid hypoperfusion of left arm, as readings on the left are 80/60.  Continue telmisartan and amlodipine .

## 2020-12-07 ENCOUNTER — Encounter (INDEPENDENT_AMBULATORY_CARE_PROVIDER_SITE_OTHER): Payer: Medicare Other

## 2020-12-07 ENCOUNTER — Encounter (INDEPENDENT_AMBULATORY_CARE_PROVIDER_SITE_OTHER): Payer: Medicare Other | Admitting: Vascular Surgery

## 2020-12-07 DIAGNOSIS — D2239 Melanocytic nevi of other parts of face: Secondary | ICD-10-CM | POA: Diagnosis not present

## 2020-12-07 DIAGNOSIS — L538 Other specified erythematous conditions: Secondary | ICD-10-CM | POA: Diagnosis not present

## 2020-12-07 DIAGNOSIS — L820 Inflamed seborrheic keratosis: Secondary | ICD-10-CM | POA: Diagnosis not present

## 2020-12-09 ENCOUNTER — Telehealth: Payer: Self-pay | Admitting: Internal Medicine

## 2020-12-09 NOTE — Telephone Encounter (Signed)
Pt looks like she is up to date on vaccines

## 2020-12-09 NOTE — Telephone Encounter (Signed)
Patient calling in to inquire on what immunizations she is due for.   Patient states that of she is not due for anything she does not need a call back.   Please advise

## 2020-12-10 NOTE — Telephone Encounter (Signed)
LMTCB

## 2020-12-10 NOTE — Telephone Encounter (Signed)
Patient informed and verbalized understanding.  High dose flu shots not currently in office in order to schedule Patient for flu shot nurse visit.   Patient will call back in to scheduled. Informed the Patient that we are hoping to have these in office by next week.

## 2020-12-19 DIAGNOSIS — Z23 Encounter for immunization: Secondary | ICD-10-CM | POA: Diagnosis not present

## 2021-02-15 DIAGNOSIS — M5136 Other intervertebral disc degeneration, lumbar region: Secondary | ICD-10-CM | POA: Diagnosis not present

## 2021-02-15 DIAGNOSIS — Z79899 Other long term (current) drug therapy: Secondary | ICD-10-CM | POA: Diagnosis not present

## 2021-02-15 DIAGNOSIS — M1A00X0 Idiopathic chronic gout, unspecified site, without tophus (tophi): Secondary | ICD-10-CM | POA: Diagnosis not present

## 2021-02-22 ENCOUNTER — Telehealth: Payer: Self-pay | Admitting: Internal Medicine

## 2021-02-22 DIAGNOSIS — M5416 Radiculopathy, lumbar region: Secondary | ICD-10-CM | POA: Diagnosis not present

## 2021-02-22 DIAGNOSIS — M5441 Lumbago with sciatica, right side: Secondary | ICD-10-CM | POA: Diagnosis not present

## 2021-02-22 DIAGNOSIS — M5136 Other intervertebral disc degeneration, lumbar region: Secondary | ICD-10-CM | POA: Diagnosis not present

## 2021-02-22 MED ORDER — AMLODIPINE BESYLATE 5 MG PO TABS: 5.0000 mg | ORAL_TABLET | Freq: Every day | ORAL | 1 refills | Status: AC

## 2021-02-22 NOTE — Telephone Encounter (Signed)
Patient needs a refill on her amLODipine (NORVASC) 5 MG tablet, please sent to [facility]

## 2021-03-23 ENCOUNTER — Encounter: Payer: Self-pay | Admitting: Internal Medicine

## 2021-03-25 ENCOUNTER — Other Ambulatory Visit: Payer: Self-pay

## 2021-03-25 ENCOUNTER — Ambulatory Visit (INDEPENDENT_AMBULATORY_CARE_PROVIDER_SITE_OTHER): Payer: Medicare PPO

## 2021-03-25 DIAGNOSIS — H6122 Impacted cerumen, left ear: Secondary | ICD-10-CM

## 2021-03-25 NOTE — Progress Notes (Signed)
Patient presented for ear irrigation due to cerumen impaction. Patient was informed of the possible side effects of having their ear flushed; light headedness, dizziness, nausea, vomiting and rupture ear drum. Items used or that can be used are the elephant pump, catch basin, ear curettes, hydrogen peroxide (half a bottle for ear irrigation solution), and a stool softener (1-2CC for softening ear wax).  Patient has given a verbal consent to have ear irrigation. patient voiced no concerns nor showed any signs of distress during procedure. Ear canal has become visibly clear.

## 2021-04-21 ENCOUNTER — Other Ambulatory Visit: Payer: Self-pay | Admitting: Internal Medicine

## 2021-04-27 DIAGNOSIS — M1A00X0 Idiopathic chronic gout, unspecified site, without tophus (tophi): Secondary | ICD-10-CM | POA: Diagnosis not present

## 2021-04-27 DIAGNOSIS — M5136 Other intervertebral disc degeneration, lumbar region: Secondary | ICD-10-CM | POA: Diagnosis not present

## 2021-04-27 DIAGNOSIS — Z79899 Other long term (current) drug therapy: Secondary | ICD-10-CM | POA: Diagnosis not present

## 2021-05-25 ENCOUNTER — Other Ambulatory Visit: Payer: Self-pay | Admitting: Internal Medicine

## 2021-06-05 ENCOUNTER — Telehealth: Payer: Self-pay | Admitting: Family Medicine

## 2021-06-05 DIAGNOSIS — E161 Other hypoglycemia: Secondary | ICD-10-CM | POA: Diagnosis not present

## 2021-06-05 DIAGNOSIS — I469 Cardiac arrest, cause unspecified: Secondary | ICD-10-CM | POA: Diagnosis not present

## 2021-06-05 DIAGNOSIS — E162 Hypoglycemia, unspecified: Secondary | ICD-10-CM | POA: Diagnosis not present

## 2021-06-05 DIAGNOSIS — I499 Cardiac arrhythmia, unspecified: Secondary | ICD-10-CM | POA: Diagnosis not present

## 2021-06-07 ENCOUNTER — Telehealth: Payer: Self-pay

## 2021-06-08 NOTE — Telephone Encounter (Signed)
error 

## 2021-06-16 DIAGNOSIS — 419620001 Death: Secondary | SNOMED CT | POA: Diagnosis not present

## 2021-06-16 NOTE — Telephone Encounter (Signed)
Answering service call.  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 Friend was unable to contact Miranda Watkins today - usually spends time with her on Saturday.  Friend arrived at home, Miranda Watkins was on the living room floor unresponsive.  [facility] was called.  Found to be cold, mottled, stiff.  CPR was attempted for 30 minutes without ROSC.   Pronounced deceased by EMS at 1601.  Discussed scene with officer- no sign of injury, no bleeding, no items at home in disarray. No food seen, no vomitus, no sign of injury or bleeding. Appears to have passed from natural causes.   I called patient's daughter Miranda Watkins, expressed condolences on Miranda Watkins's passing.  Advised that I would route information to Dr. Derrel Nip but to let us know if we can assist during this difficult time.  Thanked me for the phone call.  Will route message to Dr. Derrel Nip for completion of death certificate.

## 2021-06-16 DEATH — deceased

## 2023-01-15 IMAGING — US US EXTREM LOW VENOUS*L*
1 series · 13 of 24 positions shown · non-contrast
Comparison: None.

CLINICAL DATA: Left lower extremity pain for the past 3 weeks.
History of breast cancer. Evaluate for DVT.



[Series 1: us extrem low venous*left* · 0.06mm/px · 13 of 44 slices shown]
[im 1/44]
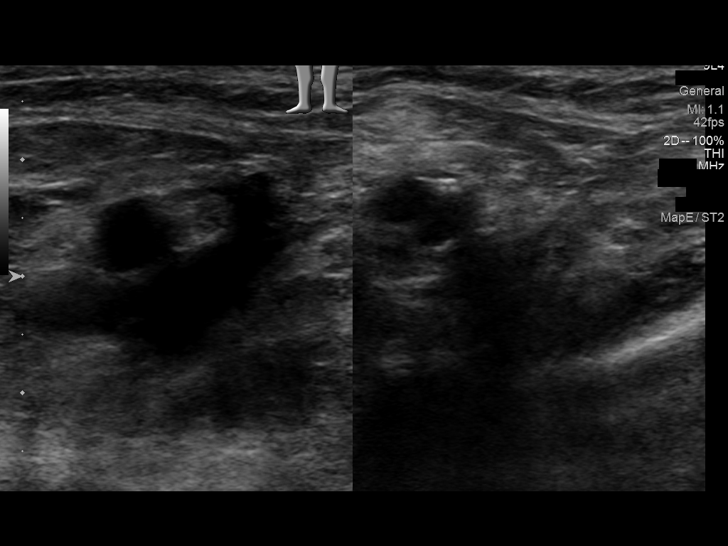
[im 4/44]
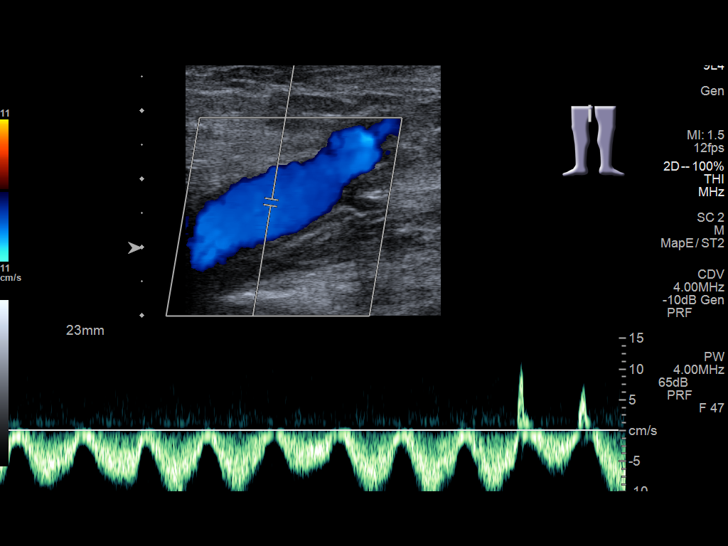
[im 8/44]
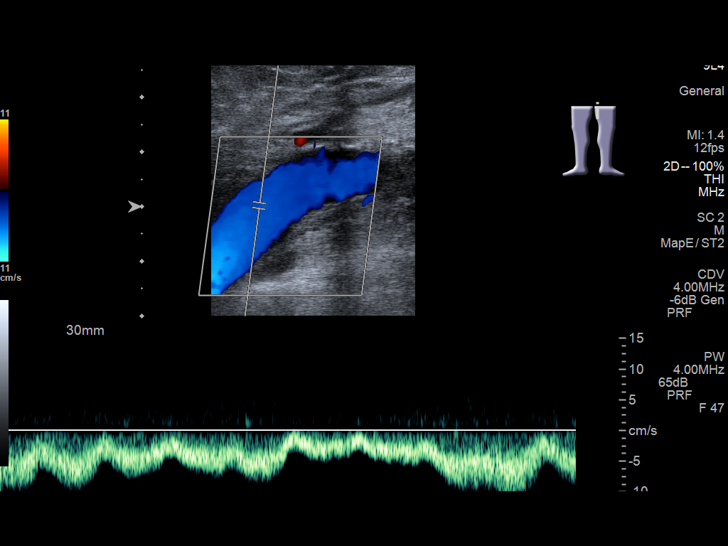
[im 12/44]
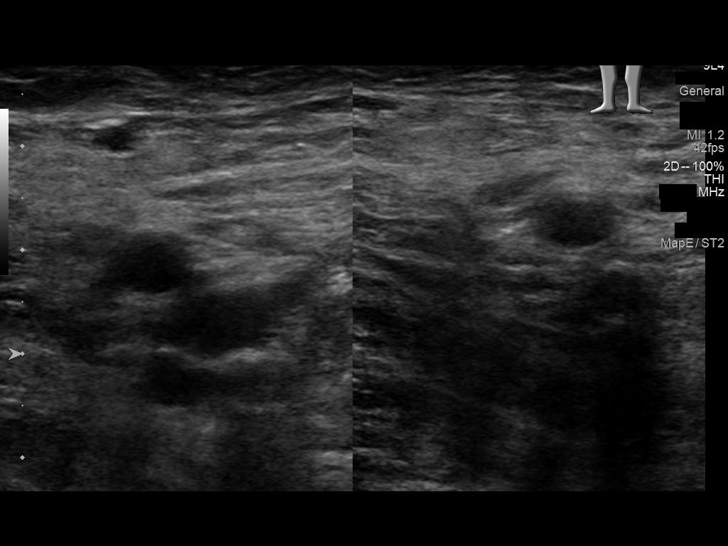
[im 15/44]
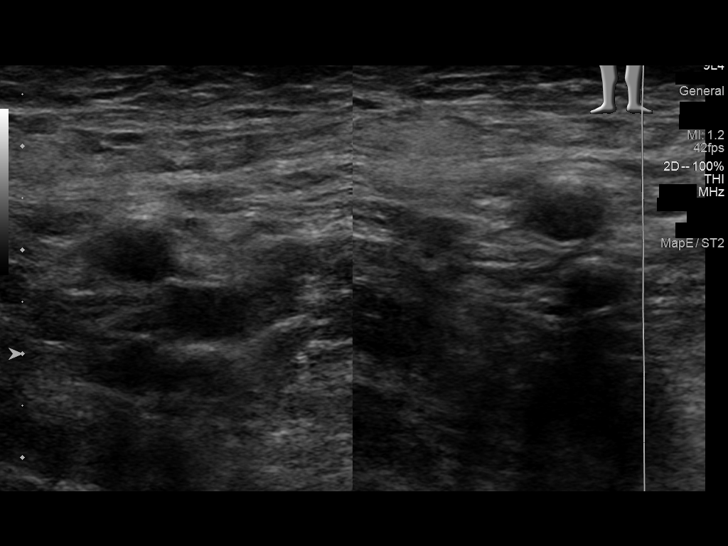
[im 19/44]
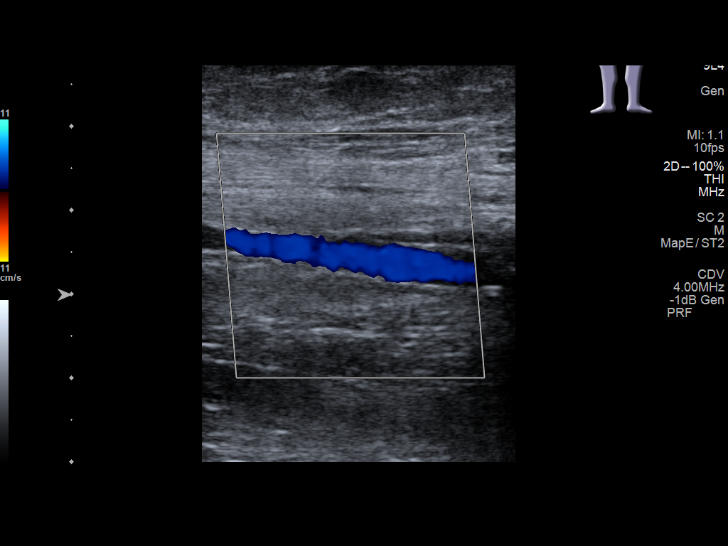
[im 23/44]
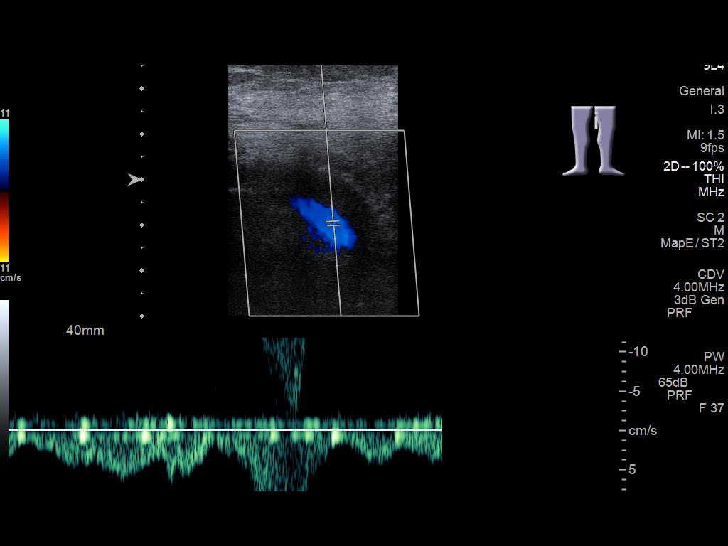
[im 25/44]
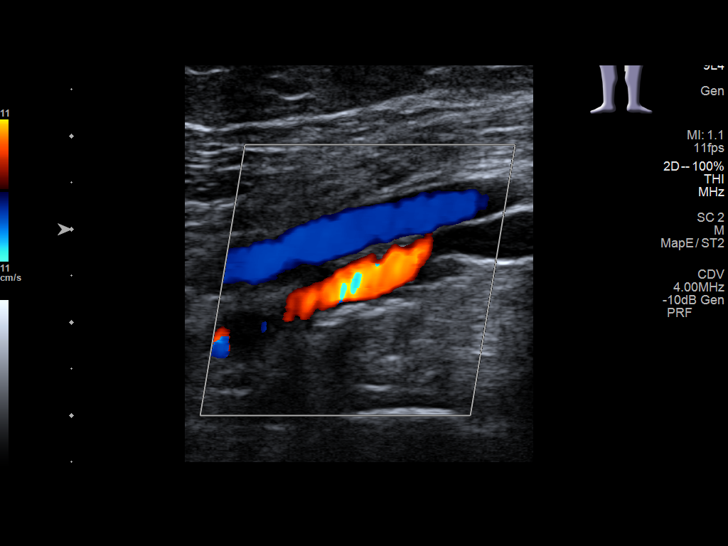
[im 29/44]
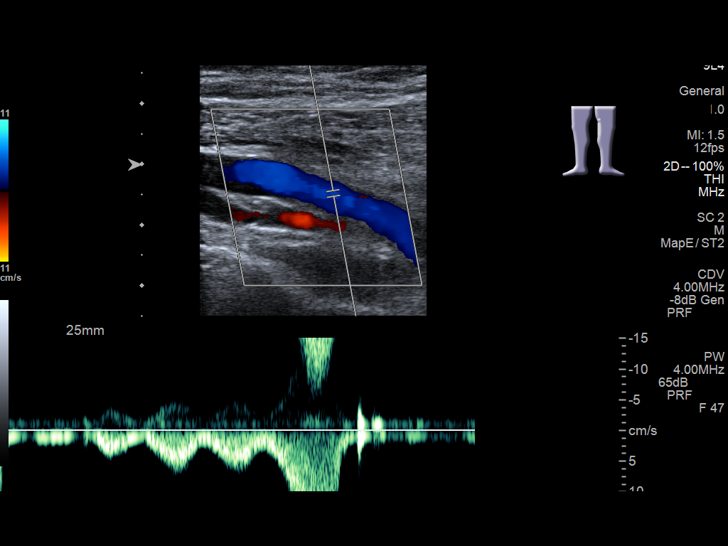
[im 32/44]
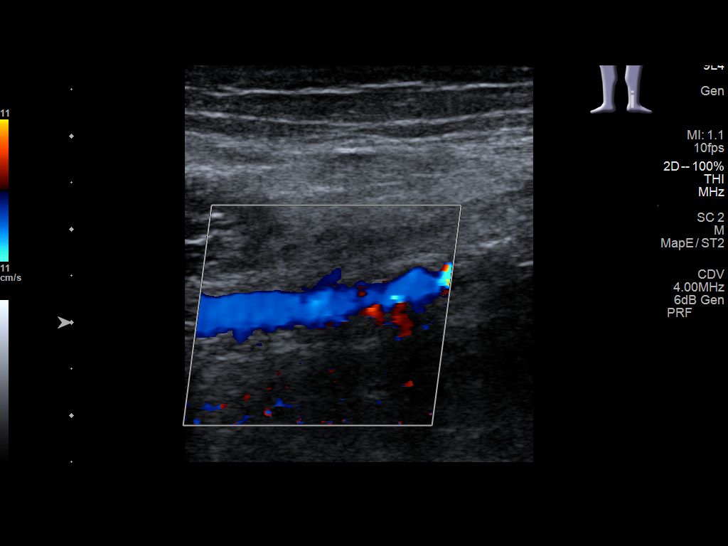
[im 36/44]
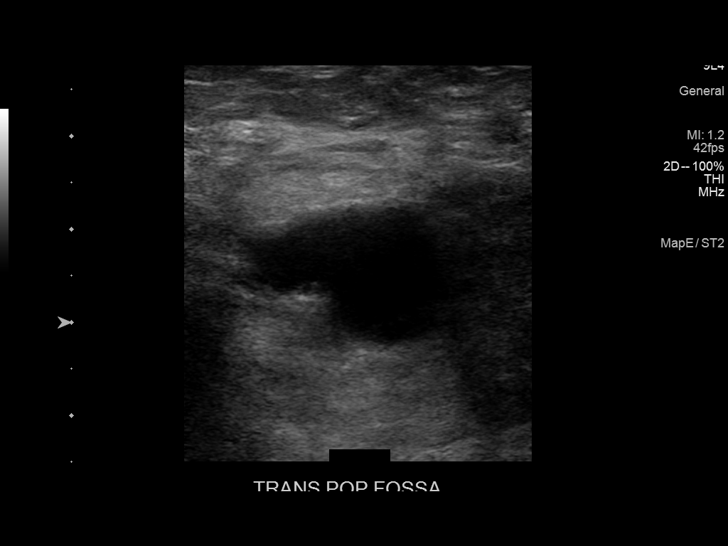
[im 40/44]
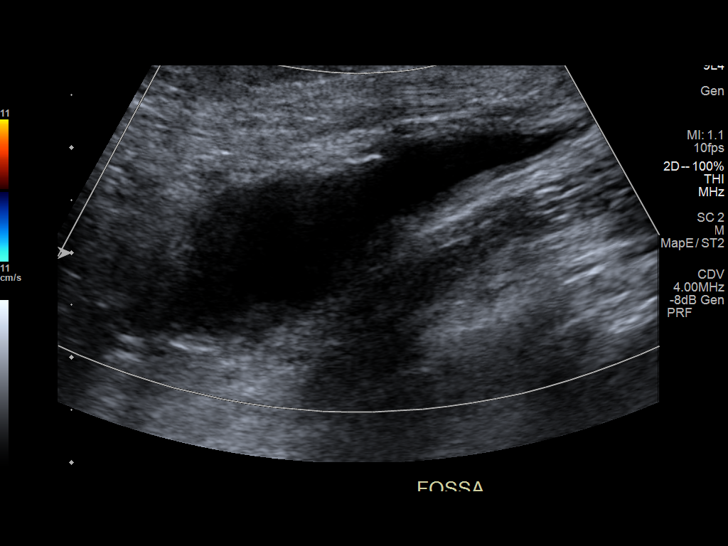
[im 44/44]
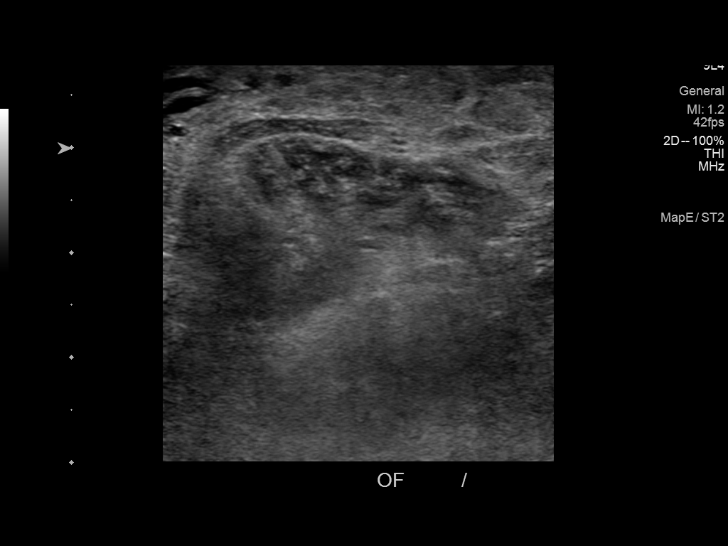

[13 of 24 positions shown; findings below may reference images not displayed]

FINDINGS: Contralateral Common Femoral Vein: Respiratory phasicity is normal
and symmetric with the symptomatic side. No evidence of thrombus.
Normal compressibility.

Common Femoral Vein: No evidence of thrombus. Normal
compressibility, respiratory phasicity and response to augmentation.

Saphenofemoral Junction: No evidence of thrombus. Normal
compressibility and flow on color Doppler imaging.

Profunda Femoral Vein: No evidence of thrombus. Normal
compressibility and flow on color Doppler imaging.

Femoral Vein: No evidence of thrombus. Normal compressibility,
respiratory phasicity and response to augmentation.

Popliteal Vein: No evidence of thrombus. Normal compressibility,
respiratory phasicity and response to augmentation.

Calf Veins: No evidence of thrombus. Normal compressibility and flow
on color Doppler imaging.

Superficial Great Saphenous Vein: No evidence of thrombus. Normal
compressibility.

Venous Reflux:  None.

Other Findings: Note is made of an approximately 4.0 x 1.4 x 2.1 cm
fluid collection with left popliteal fossa compatible with a Baker's
cyst.

There is no sonographic correlate for patient's area discomfort
involving the lateral aspect of the ankle (images [id number]).
IMPRESSION: 1. No evidence of DVT within the left lower extremity.
2. Incidental note made of an approximately 4.0 cm left-sided
Baker's cyst.
3. No sonographic correlate for patient's area of discomfort
involving the lateral aspect of the ankle.
